# Patient Record
Sex: Female | Born: 1965 | Race: White | Hispanic: No | Marital: Single
Health system: Southern US, Community
[De-identification: ages and names within clinical notes are randomized; demographics above are authoritative.]

---

## 2004-02-24 ENCOUNTER — Inpatient Hospital Stay: Payer: Self-pay | Admitting: Unknown Physician Specialty

## 2004-03-26 ENCOUNTER — Observation Stay: Payer: Self-pay

## 2004-04-27 ENCOUNTER — Inpatient Hospital Stay: Payer: Self-pay | Admitting: Obstetrics & Gynecology

## 2004-05-29 ENCOUNTER — Ambulatory Visit: Payer: Self-pay | Admitting: Specialist

## 2004-08-19 ENCOUNTER — Emergency Department: Payer: Self-pay | Admitting: Emergency Medicine

## 2004-08-22 ENCOUNTER — Emergency Department: Payer: Self-pay | Admitting: General Practice

## 2005-11-04 ENCOUNTER — Emergency Department: Payer: Self-pay | Admitting: Emergency Medicine

## 2006-11-20 ENCOUNTER — Emergency Department: Payer: Self-pay | Admitting: Emergency Medicine

## 2007-04-25 ENCOUNTER — Emergency Department: Payer: Self-pay | Admitting: Emergency Medicine

## 2007-07-27 ENCOUNTER — Emergency Department: Payer: Self-pay | Admitting: Emergency Medicine

## 2007-09-06 ENCOUNTER — Observation Stay: Payer: Self-pay | Admitting: Internal Medicine

## 2007-09-06 ENCOUNTER — Other Ambulatory Visit: Payer: Self-pay

## 2007-09-13 ENCOUNTER — Emergency Department: Payer: Self-pay | Admitting: Emergency Medicine

## 2008-07-14 ENCOUNTER — Emergency Department: Payer: Self-pay | Admitting: Emergency Medicine

## 2008-10-18 ENCOUNTER — Emergency Department: Payer: Self-pay | Admitting: Emergency Medicine

## 2009-11-23 ENCOUNTER — Emergency Department: Payer: Self-pay | Admitting: Emergency Medicine

## 2010-11-13 ENCOUNTER — Inpatient Hospital Stay: Payer: Self-pay | Admitting: Internal Medicine

## 2010-11-28 ENCOUNTER — Inpatient Hospital Stay: Payer: Self-pay | Admitting: Internal Medicine

## 2011-06-12 ENCOUNTER — Inpatient Hospital Stay: Payer: Self-pay | Admitting: Internal Medicine

## 2011-06-12 LAB — CBC WITH DIFFERENTIAL/PLATELET
Basophil #: 0 10*3/uL (ref 0.0–0.1)
Basophil %: 0.2 %
Comment - H1-Com1: NORMAL
Comment - H1-Com2: NORMAL
Eosinophil #: 1.5 10*3/uL — ABNORMAL HIGH (ref 0.0–0.7)
Eosinophil %: 9.5 %
Eosinophil: 8 %
HGB: 14.5 g/dL (ref 12.0–16.0)
MCH: 31.5 pg (ref 26.0–34.0)
MCV: 92 fL (ref 80–100)
Monocyte #: 0.6 10*3/uL (ref 0.0–0.7)
Neutrophil %: 61.4 %
RBC: 4.6 10*6/uL (ref 3.80–5.20)
WBC: 15.4 10*3/uL — ABNORMAL HIGH (ref 3.6–11.0)

## 2011-06-12 LAB — COMPREHENSIVE METABOLIC PANEL
Albumin: 3.8 g/dL (ref 3.4–5.0)
Anion Gap: 11 (ref 7–16)
Calcium, Total: 8.8 mg/dL (ref 8.5–10.1)
Creatinine: 0.69 mg/dL (ref 0.60–1.30)
EGFR (Non-African Amer.): >60
Glucose: 92 mg/dL (ref 65–99)
Osmolality: 283 (ref 275–301)
Potassium: 3.4 mmol/L — ABNORMAL LOW (ref 3.5–5.1)
SGOT(AST): 19 U/L (ref 15–37)
SGPT (ALT): 18 U/L

## 2011-06-12 LAB — TROPONIN I
Troponin-I: <0.02 ng/mL
Troponin-I: <0.02 ng/mL
Troponin-I: <0.02 ng/mL

## 2011-06-12 LAB — CK TOTAL AND CKMB (NOT AT ARMC)
CK, Total: 54 U/L (ref 21–215)
CK, Total: 55 U/L (ref 21–215)
CK, Total: 52 U/L (ref 21–215)
CK-MB: 0.7 ng/mL (ref 0.5–3.6)
CK-MB: 1.1 ng/mL (ref 0.5–3.6)

## 2011-06-13 LAB — URINALYSIS, COMPLETE
Glucose,UR: >=500 mg/dL (ref 0–75)
Ketone: NEGATIVE
Leukocyte Esterase: NEGATIVE
Nitrite: NEGATIVE
Ph: 6 (ref 4.5–8.0)
Protein: NEGATIVE
RBC,UR: NONE SEEN /HPF (ref 0–5)
Specific Gravity: 1.006 (ref 1.003–1.030)
WBC UR: <1 /HPF (ref 0–5)

## 2011-06-13 LAB — CBC WITH DIFFERENTIAL/PLATELET
Basophil %: 0.1 %
Eosinophil %: 0.1 %
Lymphocyte #: 1.7 10*3/uL (ref 1.0–3.6)
MCH: 31.8 pg (ref 26.0–34.0)
MCHC: 34.1 g/dL (ref 32.0–36.0)
MCV: 93 fL (ref 80–100)
Monocyte %: 1.3 %
Platelet: 288 10*3/uL (ref 150–440)
RBC: 3.76 10*6/uL — ABNORMAL LOW (ref 3.80–5.20)

## 2011-06-13 LAB — DRUG SCREEN, URINE
Amphetamines, Ur Screen: NEGATIVE (ref ?–1000)
Benzodiazepine, Ur Scrn: POSITIVE (ref ?–200)
Cannabinoid 50 Ng, Ur ~~LOC~~: NEGATIVE (ref ?–50)
Cocaine Metabolite,Ur ~~LOC~~: POSITIVE (ref ?–300)
MDMA (Ecstasy)Ur Screen: NEGATIVE (ref ?–500)
Phencyclidine (PCP) Ur S: NEGATIVE (ref ?–25)

## 2011-06-13 LAB — BASIC METABOLIC PANEL
Chloride: 108 mmol/L — ABNORMAL HIGH (ref 98–107)
Co2: 19 mmol/L — ABNORMAL LOW (ref 21–32)
Creatinine: 0.94 mg/dL (ref 0.60–1.30)
EGFR (African American): >60
Potassium: 3.6 mmol/L (ref 3.5–5.1)
Sodium: 138 mmol/L (ref 136–145)

## 2011-07-29 ENCOUNTER — Inpatient Hospital Stay: Payer: Self-pay | Admitting: Specialist

## 2011-07-29 LAB — COMPREHENSIVE METABOLIC PANEL
Albumin: 3.8 g/dL (ref 3.4–5.0)
Alkaline Phosphatase: 111 U/L (ref 50–136)
BUN: 4 mg/dL — ABNORMAL LOW (ref 7–18)
Calcium, Total: 8.7 mg/dL (ref 8.5–10.1)
Chloride: 109 mmol/L — ABNORMAL HIGH (ref 98–107)
Co2: 20 mmol/L — ABNORMAL LOW (ref 21–32)
EGFR (African American): >60
EGFR (Non-African Amer.): >60
Osmolality: 280 (ref 275–301)
SGOT(AST): 16 U/L (ref 15–37)
SGPT (ALT): 14 U/L
Total Protein: 7.7 g/dL (ref 6.4–8.2)

## 2011-07-29 LAB — TROPONIN I: Troponin-I: <0.02 ng/mL

## 2011-07-29 LAB — CBC
HCT: 42.5 % (ref 35.0–47.0)
MCH: 31.3 pg (ref 26.0–34.0)
MCHC: 33.9 g/dL (ref 32.0–36.0)
Platelet: 210 10*3/uL (ref 150–440)
RBC: 4.6 10*6/uL (ref 3.80–5.20)
RDW: 13.2 % (ref 11.5–14.5)
WBC: 11.9 10*3/uL — ABNORMAL HIGH (ref 3.6–11.0)

## 2011-07-30 LAB — DRUG SCREEN, URINE
Amphetamines, Ur Screen: NEGATIVE (ref ?–1000)
Barbiturates, Ur Screen: NEGATIVE (ref ?–200)
Benzodiazepine, Ur Scrn: NEGATIVE (ref ?–200)
Cannabinoid 50 Ng, Ur ~~LOC~~: NEGATIVE (ref ?–50)
MDMA (Ecstasy)Ur Screen: NEGATIVE (ref ?–500)
Methadone, Ur Screen: NEGATIVE (ref ?–300)
Phencyclidine (PCP) Ur S: NEGATIVE (ref ?–25)
Tricyclic, Ur Screen: POSITIVE (ref ?–1000)

## 2011-07-30 LAB — URINALYSIS, COMPLETE
Bacteria: NONE SEEN
Bilirubin,UR: NEGATIVE
Glucose,UR: NEGATIVE mg/dL (ref 0–75)
Ketone: NEGATIVE
Nitrite: NEGATIVE
Ph: 6 (ref 4.5–8.0)
Protein: NEGATIVE
RBC,UR: NONE SEEN /HPF (ref 0–5)
Squamous Epithelial: 1

## 2011-07-30 LAB — CBC WITH DIFFERENTIAL/PLATELET
Basophil #: 0 10*3/uL (ref 0.0–0.1)
Eosinophil #: 0 10*3/uL (ref 0.0–0.7)
HGB: 14.5 g/dL (ref 12.0–16.0)
Lymphocyte %: 9 %
MCHC: 33.6 g/dL (ref 32.0–36.0)
MCV: 94 fL (ref 80–100)
Monocyte #: 0 10*3/uL (ref 0.0–0.7)
Monocyte %: 0.3 %
Neutrophil %: 90.2 %
Platelet: 205 10*3/uL (ref 150–440)
RBC: 4.62 10*6/uL (ref 3.80–5.20)
WBC: 10.8 10*3/uL (ref 3.6–11.0)

## 2011-07-30 LAB — BASIC METABOLIC PANEL
Anion Gap: 15 (ref 7–16)
Calcium, Total: 9.3 mg/dL (ref 8.5–10.1)
Chloride: 106 mmol/L (ref 98–107)
Co2: 22 mmol/L (ref 21–32)
Creatinine: 1.06 mg/dL (ref 0.60–1.30)
EGFR (African American): >60
EGFR (Non-African Amer.): 59 — ABNORMAL LOW
Glucose: 178 mg/dL — ABNORMAL HIGH (ref 65–99)
Osmolality: 287 (ref 275–301)
Potassium: 4.6 mmol/L (ref 3.5–5.1)

## 2011-07-30 LAB — MAGNESIUM: Magnesium: 1.9 mg/dL

## 2012-08-19 ENCOUNTER — Emergency Department: Payer: Self-pay | Admitting: Emergency Medicine

## 2012-08-19 LAB — CBC
HCT: 41.9 % (ref 35.0–47.0)
MCH: 31 pg (ref 26.0–34.0)
MCV: 91 fL (ref 80–100)

## 2012-08-19 LAB — BASIC METABOLIC PANEL
Anion Gap: 8 (ref 7–16)
Chloride: 111 mmol/L — ABNORMAL HIGH (ref 98–107)
Co2: 21 mmol/L (ref 21–32)
EGFR (African American): >60
Glucose: 109 mg/dL — ABNORMAL HIGH (ref 65–99)
Osmolality: 278 (ref 275–301)
Sodium: 140 mmol/L (ref 136–145)

## 2012-10-31 ENCOUNTER — Emergency Department: Payer: Self-pay | Admitting: Emergency Medicine

## 2013-01-22 IMAGING — CR DG CHEST 1V PORT
1 series · 1 of 1 positions shown · non-contrast
Comparison: none

REASON FOR EXAM: post intub.  lmp unknown, pt critical
COMMENTS:   LMP: N/A

PROCEDURE:     DXR - DXR PORTABLE CHEST SINGLE VIEW  - November 13, 2010  [DATE]
RESULT:     Comparison: None

[view not recorded]
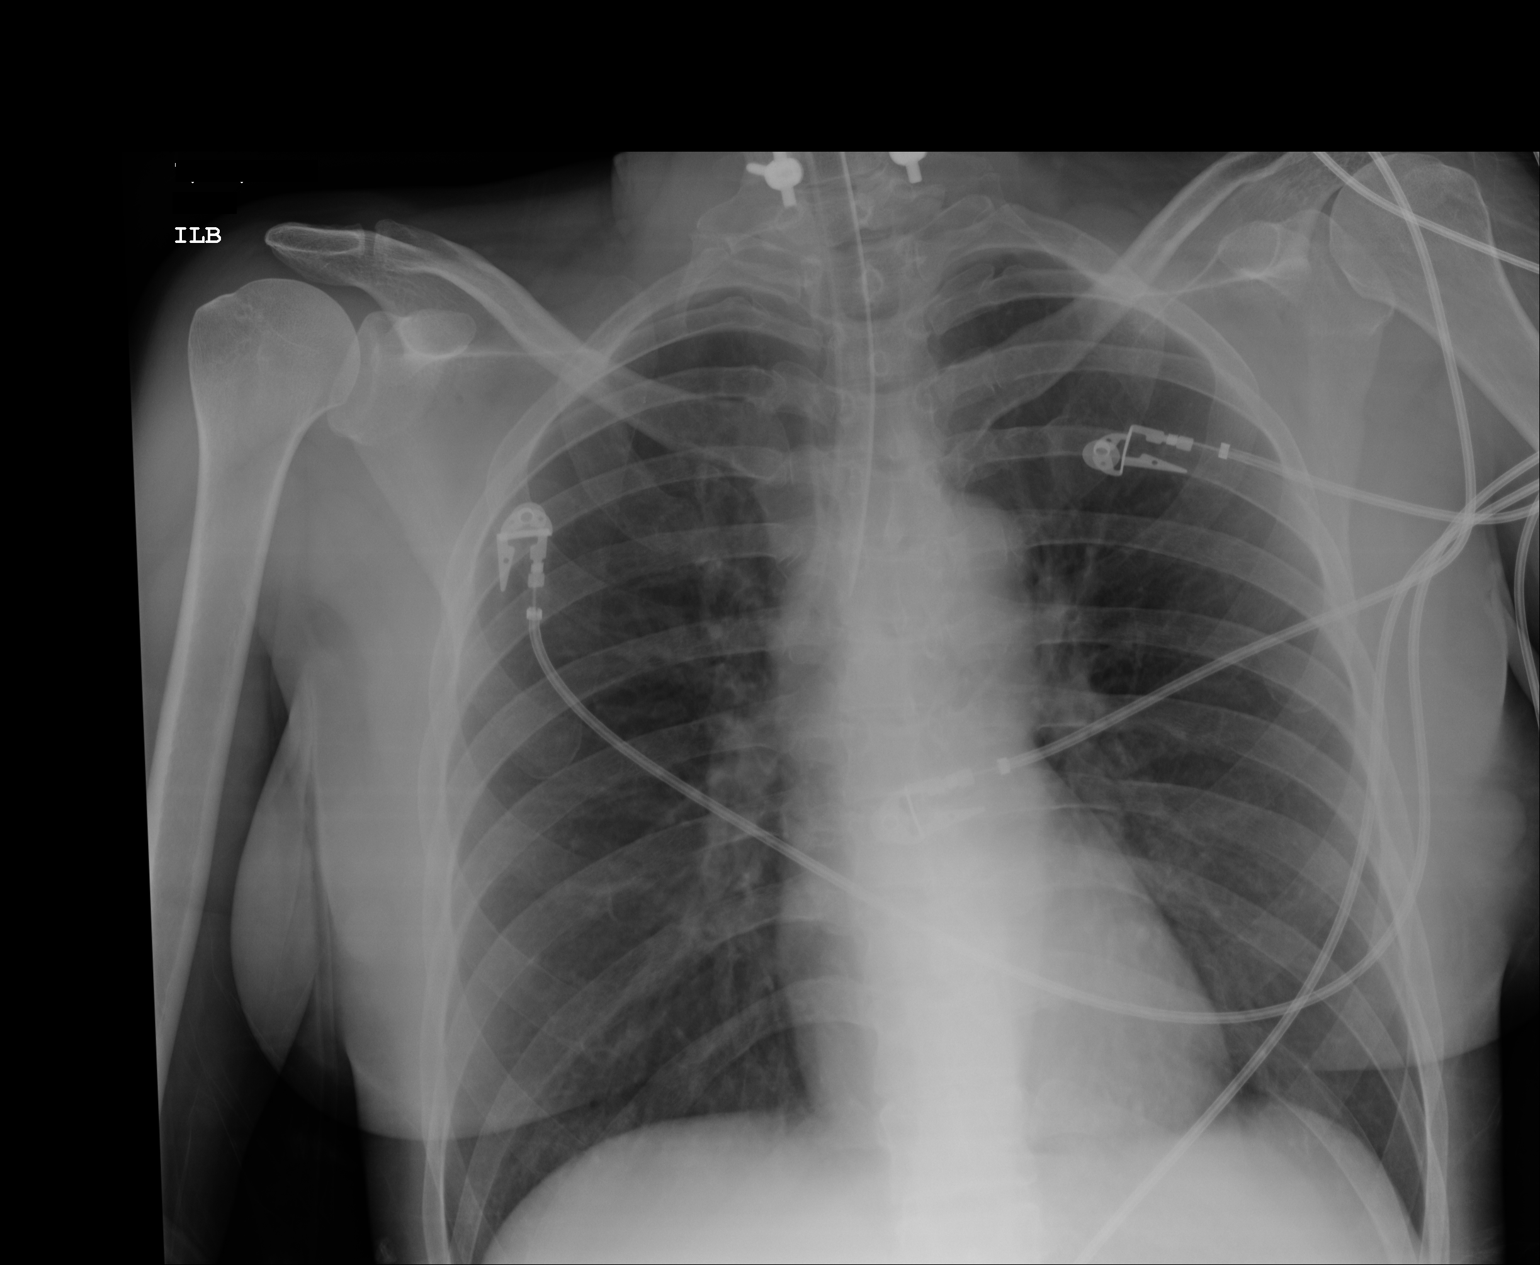

[1 of 1 positions shown; findings below may reference images not displayed]

FINDINGS: Heart is normal in size. Endotracheal tube tip is at the carina, directed
towards the right mainstem bronchus. The lungs are clear. The inferior most
aspect of the right costophrenic angle is excluded from the field-of-view.
IMPRESSION: 1. Endotracheal tube at the carina, directed towards the right mainstem
bronchus. Consider slight withdrawal. This was discussed with Dr. Auad
Enzi at 7706 hours 11/13/2010.
2. Lungs are clear.

## 2013-02-10 IMAGING — CR DG CHEST 1V PORT
1 series · 1 of 1 positions shown · non-contrast
Comparison: none

REASON FOR EXAM: on vent
COMMENTS:

[view not recorded]
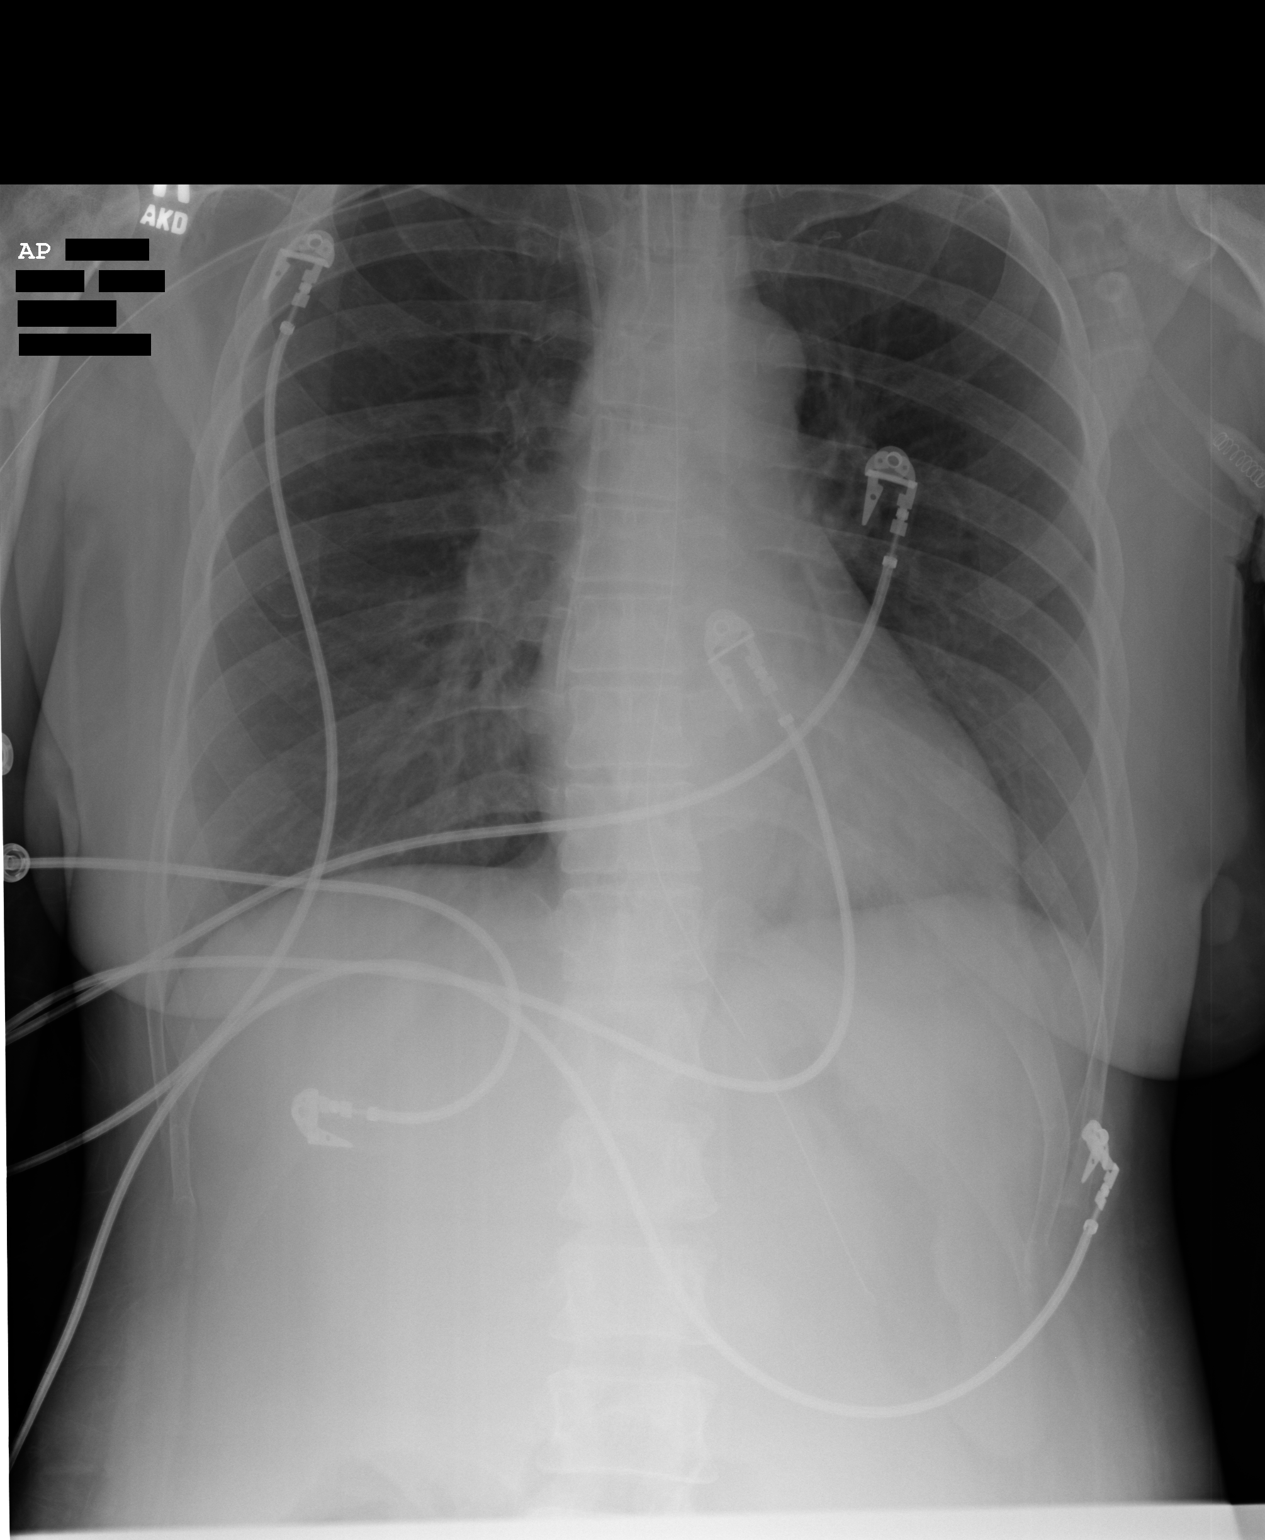

[1 of 1 positions shown; findings below may reference images not displayed]

PROCEDURE:     DXR - DXR PORTABLE CHEST SINGLE VIEW  - December 02, 2010  [DATE]

RESULT:     Comparison is made to the study 29 November, 2010.

The endotracheal tube tip lies at the level of the inferior margin of the
clavicular heads. The right internal jugular venous catheter tip lies in the
region of the junction of the SVC with the right atrium. The nasogastric
tube tip lies in the region of the gastric body with the proximal port just
below the expected level of the GE junction.

The lungs are adequately inflated. The interstitial markings are minimally
prominent at the right lung base. There is no pleural effusion. The cardiac
silhouette is normal in size. The pulmonary vascularity is not engorged.
IMPRESSION: Since the study 29 November, 2010 there has not been
dramatic interval change in the appearance of the chest. Slightly increased
prominence of the interstitial markings at the right lung base are noted.

## 2013-02-11 IMAGING — CR DG CHEST 1V PORT
1 series · 1 of 1 positions shown · non-contrast
Comparison: none

REASON FOR EXAM: right sided wheezing, post extubation
COMMENTS:

[view not recorded]
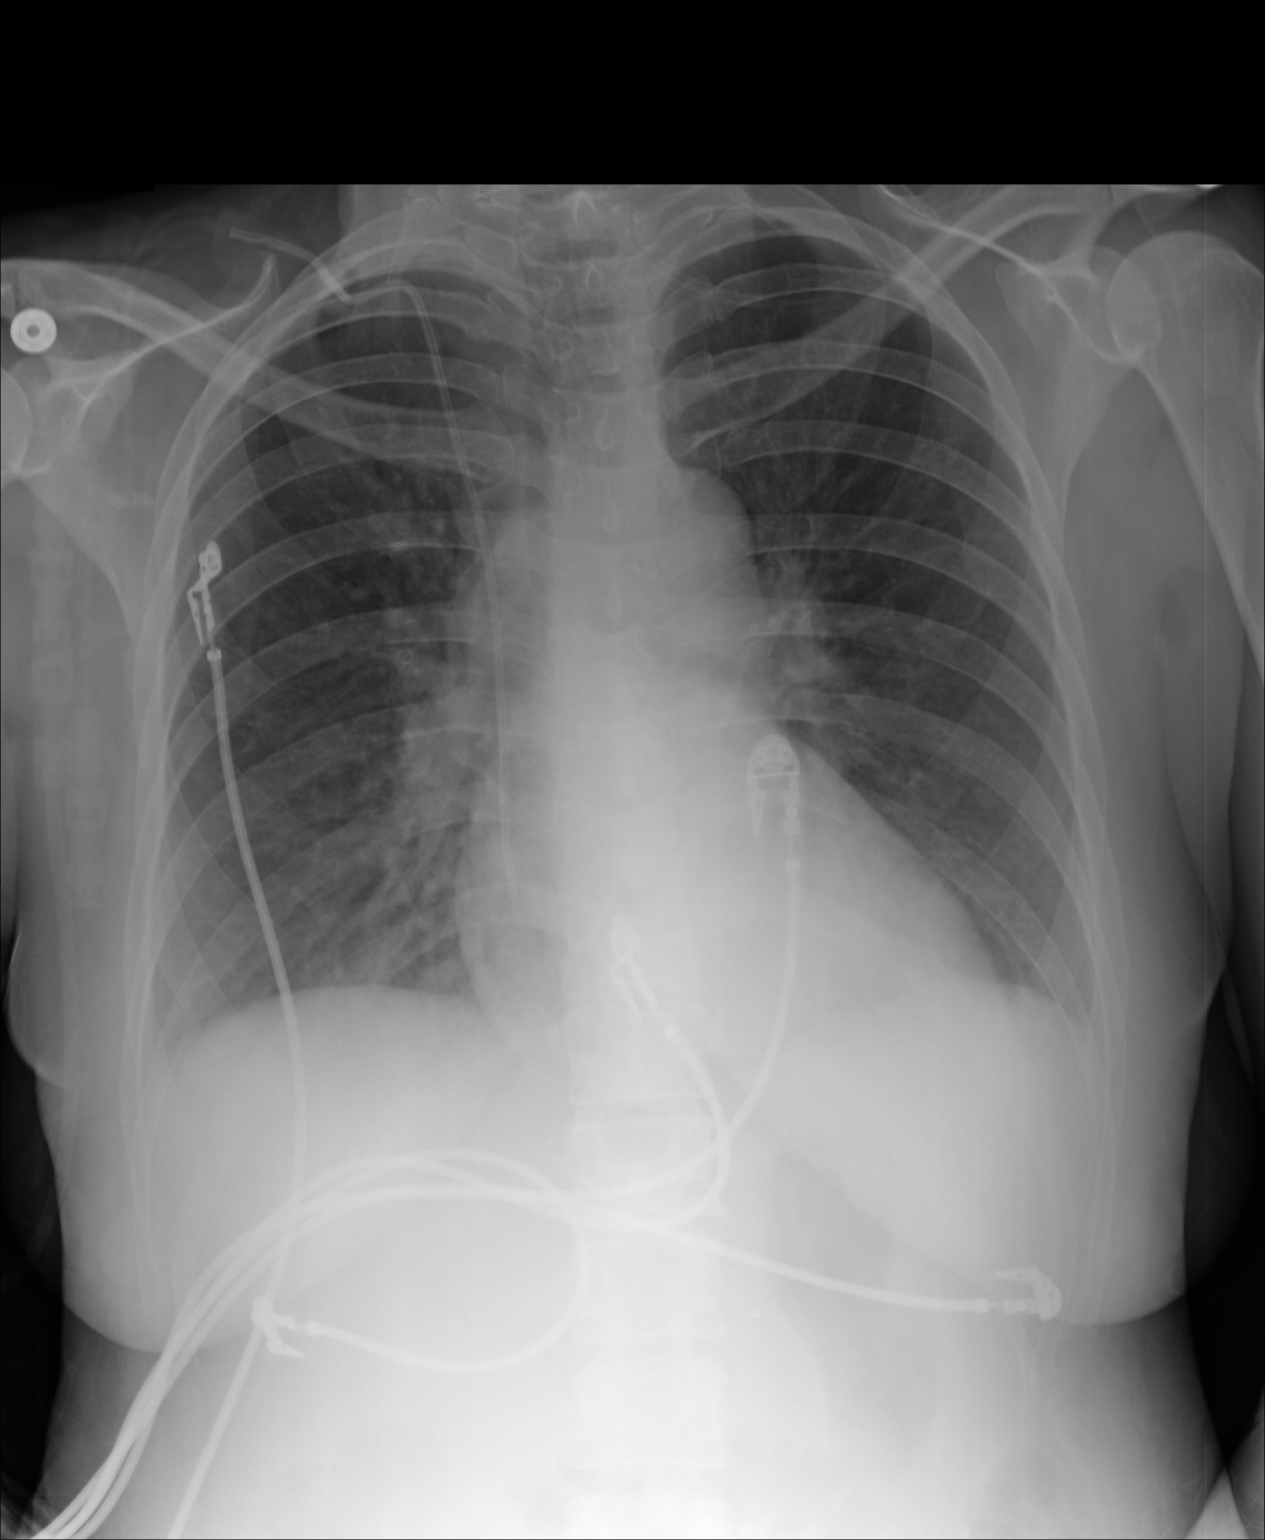

[1 of 1 positions shown; findings below may reference images not displayed]

PROCEDURE:     DXR - DXR PORTABLE CHEST SINGLE VIEW  - December 03, 2010  [DATE]

RESULT:     Images are compared to the previous exam dated 12/02/2010.

Endotracheal tube has been removed. Right sided central venous catheter
remains in place. There is no definite infiltrate, effusion or pneumothorax.
Monitoring electrodes are present. The cardiac silhouette appears normal.
There appear to be hardware densities in the cervical spine consistent with
previous cervical surgery.
IMPRESSION: Interval extubation.

## 2013-02-13 IMAGING — CR DG THORACIC SPINE 2-3V
1 series · 2 of 2 positions shown · non-contrast
Comparison: none

REASON FOR EXAM: back pain
COMMENTS:

[Series 1: view not recorded · 0.17mm/px · 2 of 2 slices shown]
[im 1/2]
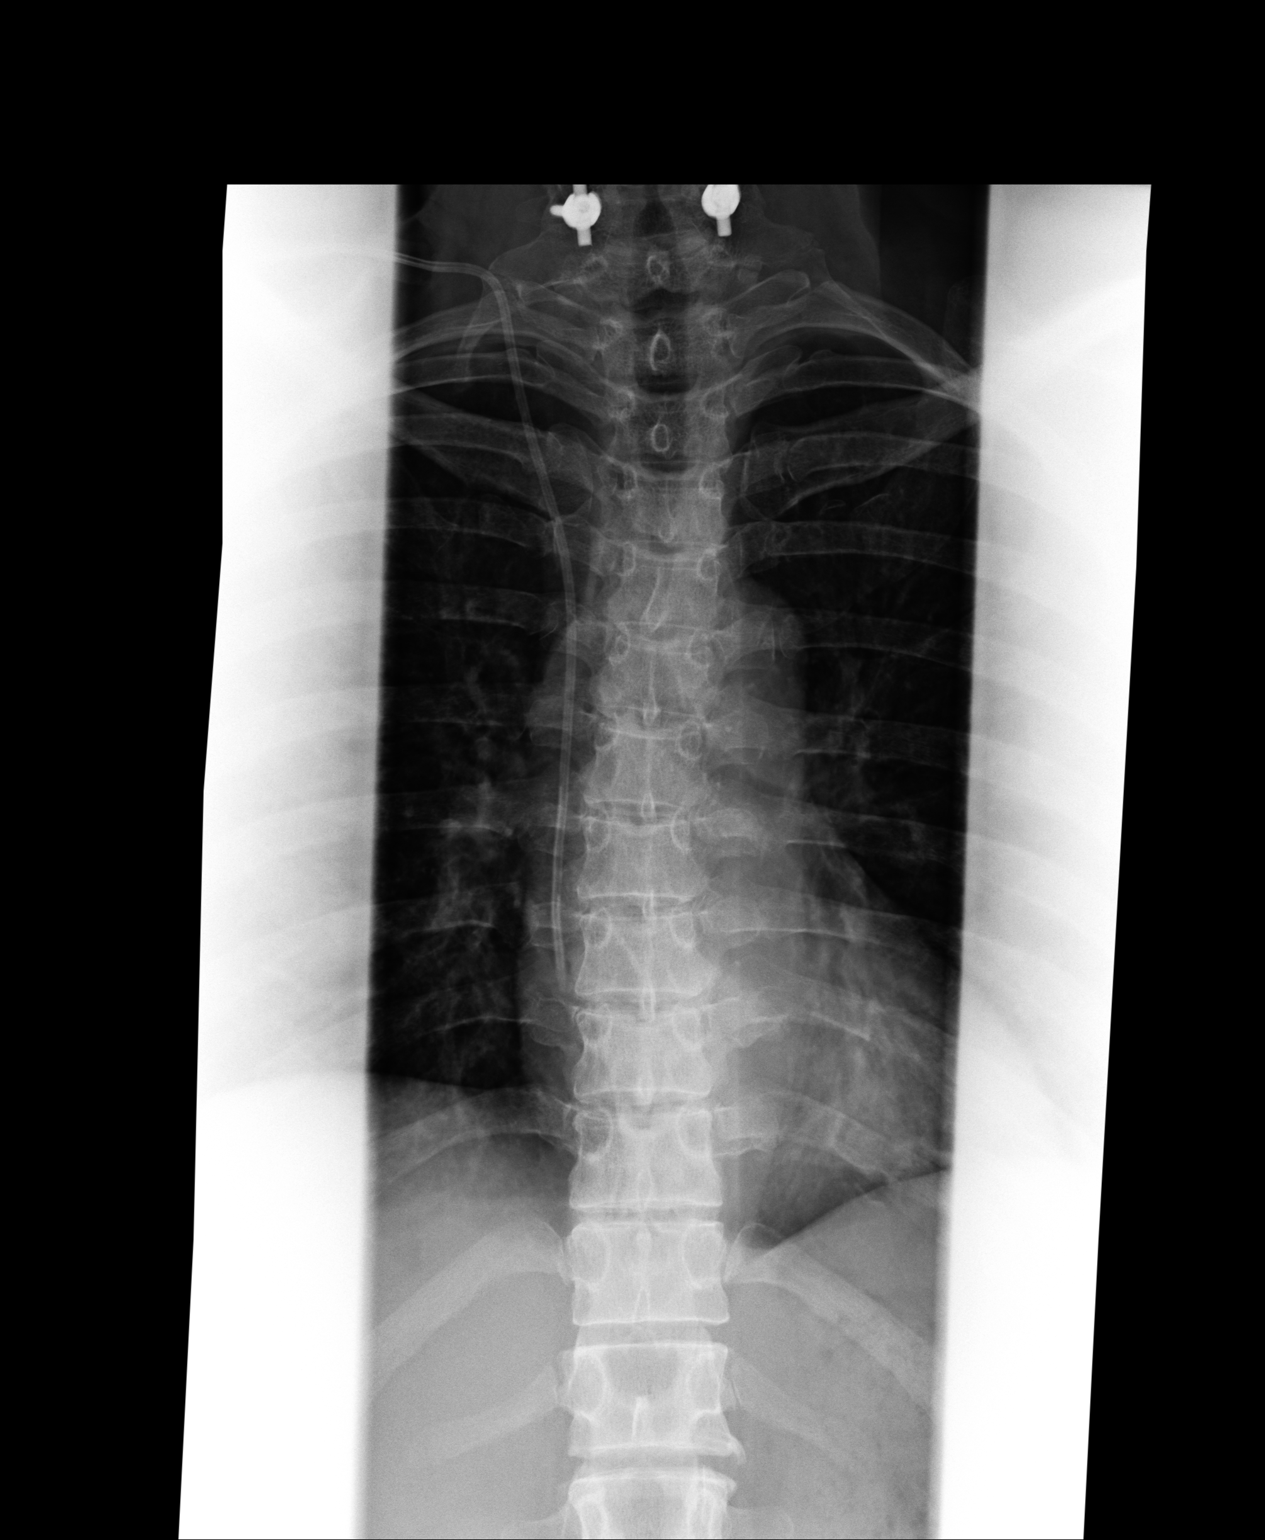
[im 2/2]
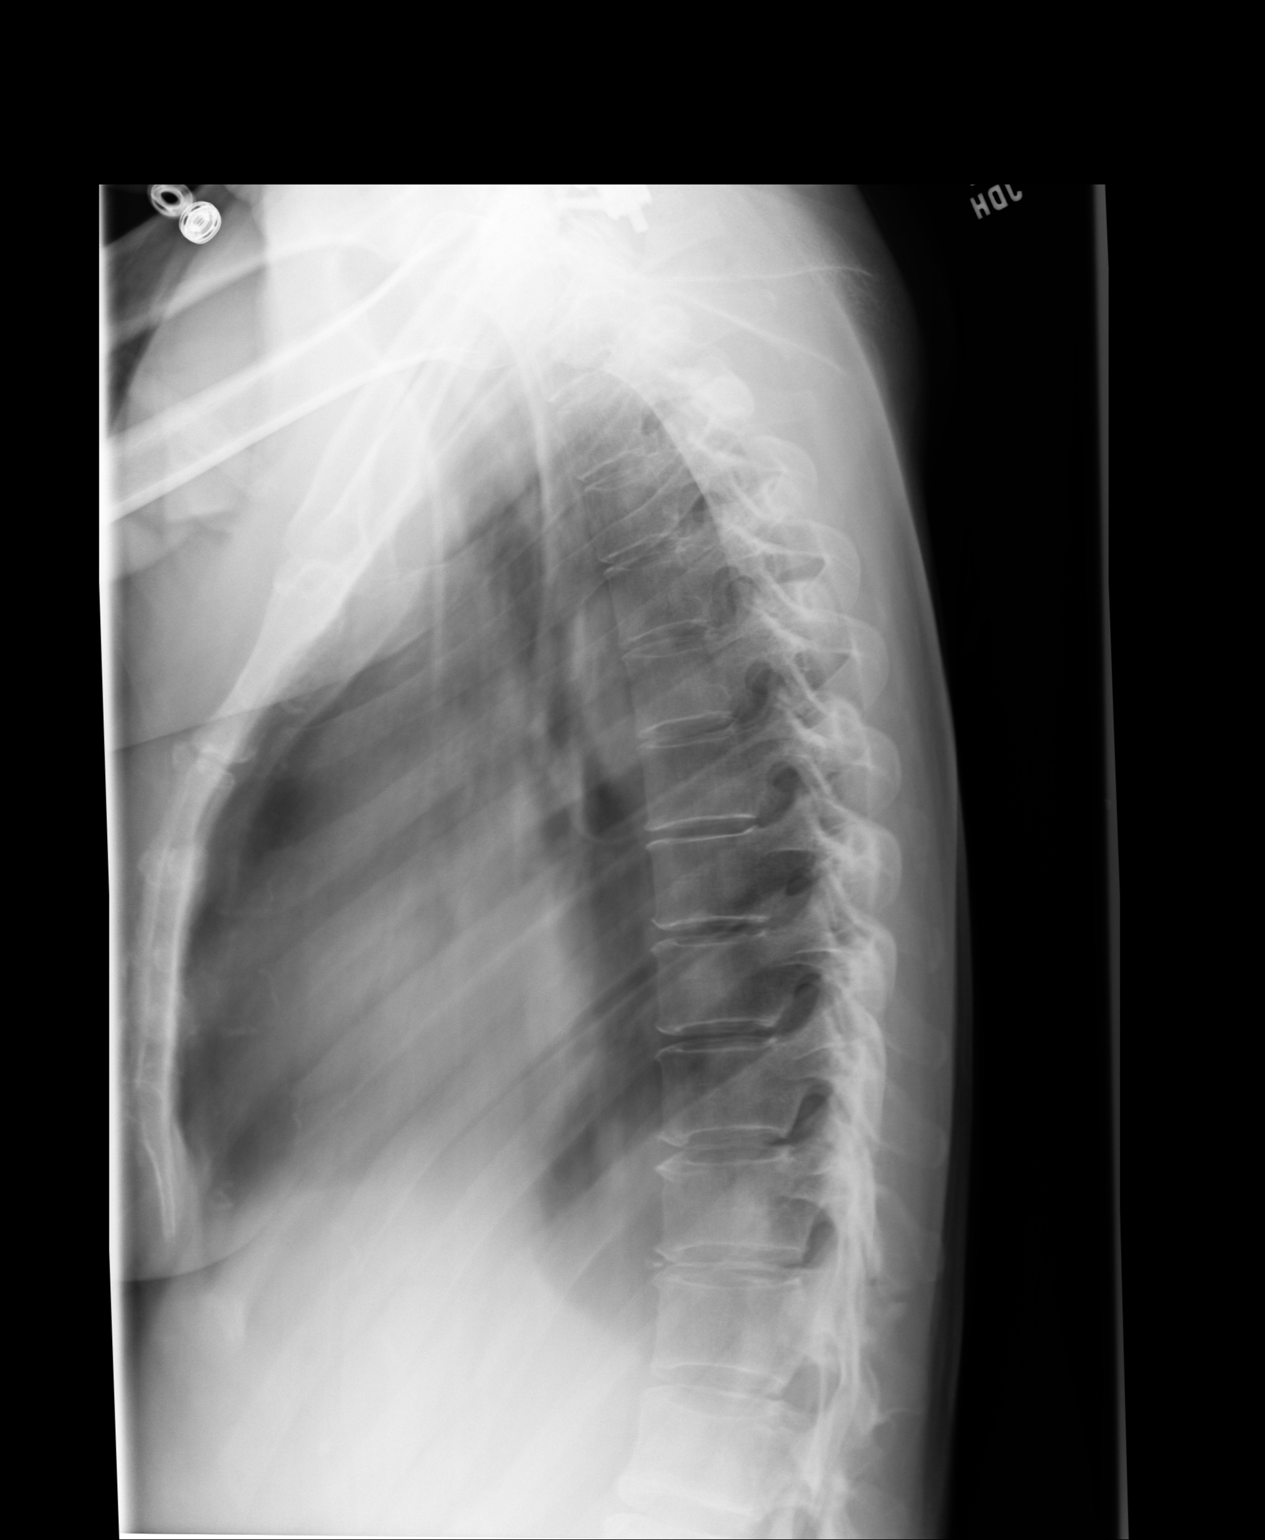

[2 of 2 positions shown; findings below may reference images not displayed]

PROCEDURE:     DXR - DXR THORACIC  AP AND LATERAL  - December 05, 2010  [DATE]

RESULT:     AP and lateral views of the thoracic spine are submitted. There
is a right internal jugular venous catheter whose tip lies in the region of
the distal SVC. There is gentle curvature of the thoracic spine with the
convexity toward the right. The pedicles appear intact. The vertebral bodies
are preserved in height where visualized. I see no abnormal paravertebral
soft tissue densities.
IMPRESSION: I see no acute bony abnormality of the visualized portions
of the thoracic spine.

## 2013-02-15 ENCOUNTER — Inpatient Hospital Stay: Payer: Self-pay | Admitting: Emergency Medicine

## 2013-02-15 LAB — COMPREHENSIVE METABOLIC PANEL
Alkaline Phosphatase: 129 U/L (ref 50–136)
Anion Gap: 11 (ref 7–16)
Calcium, Total: 8.7 mg/dL (ref 8.5–10.1)
Chloride: 102 mmol/L (ref 98–107)
Creatinine: 0.41 mg/dL — ABNORMAL LOW (ref 0.60–1.30)
EGFR (African American): >60
Osmolality: 261 (ref 275–301)
Potassium: 4.7 mmol/L (ref 3.5–5.1)
SGOT(AST): 19 U/L (ref 15–37)
Sodium: 131 mmol/L — ABNORMAL LOW (ref 136–145)

## 2013-02-15 LAB — DRUG SCREEN, URINE
Amphetamines, Ur Screen: NEGATIVE (ref ?–1000)
Benzodiazepine, Ur Scrn: POSITIVE (ref ?–200)
Cannabinoid 50 Ng, Ur ~~LOC~~: NEGATIVE (ref ?–50)
Cocaine Metabolite,Ur ~~LOC~~: POSITIVE (ref ?–300)
MDMA (Ecstasy)Ur Screen: NEGATIVE (ref ?–500)
Methadone, Ur Screen: NEGATIVE (ref ?–300)
Opiate, Ur Screen: POSITIVE (ref ?–300)
Phencyclidine (PCP) Ur S: NEGATIVE (ref ?–25)
Tricyclic, Ur Screen: NEGATIVE (ref ?–1000)

## 2013-02-15 LAB — CBC
HCT: 45.3 % (ref 35.0–47.0)
HGB: 15.6 g/dL (ref 12.0–16.0)
MCH: 31.3 pg (ref 26.0–34.0)
MCV: 91 fL (ref 80–100)
Platelet: 299 10*3/uL (ref 150–440)
RBC: 5 10*6/uL (ref 3.80–5.20)
RDW: 13.3 % (ref 11.5–14.5)

## 2013-02-15 LAB — URINALYSIS, COMPLETE
Bilirubin,UR: NEGATIVE
Blood: NEGATIVE
Glucose,UR: NEGATIVE mg/dL (ref 0–75)
Leukocyte Esterase: NEGATIVE
Nitrite: NEGATIVE
Ph: 6 (ref 4.5–8.0)
Protein: 100
Specific Gravity: 1.017 (ref 1.003–1.030)
Squamous Epithelial: 1
WBC UR: <1 /HPF (ref 0–5)

## 2013-02-15 LAB — ETHANOL: Ethanol: <3 mg/dL

## 2013-02-15 LAB — CK TOTAL AND CKMB (NOT AT ARMC)
CK, Total: 75 U/L (ref 21–215)
CK-MB: 3 ng/mL (ref 0.5–3.6)

## 2013-02-15 LAB — PRO B NATRIURETIC PEPTIDE: B-Type Natriuretic Peptide: 722 pg/mL — ABNORMAL HIGH (ref 0–125)

## 2013-02-15 LAB — CK: CK, Total: 61 U/L (ref 21–215)

## 2013-02-15 LAB — ACETAMINOPHEN LEVEL: Acetaminophen: <2 ug/mL

## 2013-02-16 LAB — CBC WITH DIFFERENTIAL/PLATELET
Basophil %: 0 %
Eosinophil #: 0 10*3/uL (ref 0.0–0.7)
Lymphocyte %: 9.9 %
MCH: 31.6 pg (ref 26.0–34.0)
MCHC: 35.2 g/dL (ref 32.0–36.0)
MCV: 90 fL (ref 80–100)
Monocyte #: 0.2 x10 3/mm (ref 0.2–0.9)
Monocyte %: 1.2 %
Neutrophil #: 13.9 10*3/uL — ABNORMAL HIGH (ref 1.4–6.5)
Platelet: 283 10*3/uL (ref 150–440)
RBC: 4.41 10*6/uL (ref 3.80–5.20)
RDW: 13 % (ref 11.5–14.5)
WBC: 15.6 10*3/uL — ABNORMAL HIGH (ref 3.6–11.0)

## 2013-02-16 LAB — BASIC METABOLIC PANEL
BUN: 9 mg/dL (ref 7–18)
Calcium, Total: 9.2 mg/dL (ref 8.5–10.1)
Chloride: 107 mmol/L (ref 98–107)
Creatinine: 0.97 mg/dL (ref 0.60–1.30)
EGFR (Non-African Amer.): >60
Glucose: 167 mg/dL — ABNORMAL HIGH (ref 65–99)
Osmolality: 276 (ref 275–301)
Potassium: 3.3 mmol/L — ABNORMAL LOW (ref 3.5–5.1)
Sodium: 137 mmol/L (ref 136–145)

## 2013-03-03 ENCOUNTER — Inpatient Hospital Stay: Payer: Self-pay | Admitting: Internal Medicine

## 2013-03-03 LAB — DRUG SCREEN, URINE
Amphetamines, Ur Screen: NEGATIVE (ref ?–1000)
Barbiturates, Ur Screen: NEGATIVE (ref ?–200)
Cannabinoid 50 Ng, Ur ~~LOC~~: NEGATIVE (ref ?–50)
Cocaine Metabolite,Ur ~~LOC~~: POSITIVE (ref ?–300)
MDMA (Ecstasy)Ur Screen: NEGATIVE (ref ?–500)
Methadone, Ur Screen: NEGATIVE (ref ?–300)
Opiate, Ur Screen: POSITIVE (ref ?–300)
Phencyclidine (PCP) Ur S: NEGATIVE (ref ?–25)
Tricyclic, Ur Screen: NEGATIVE (ref ?–1000)

## 2013-03-03 LAB — CK TOTAL AND CKMB (NOT AT ARMC)
CK, Total: 74 U/L (ref 21–215)
CK, Total: 90 U/L (ref 21–215)
CK, Total: 78 U/L (ref 21–215)
CK-MB: 1.8 ng/mL (ref 0.5–3.6)

## 2013-03-03 LAB — COMPREHENSIVE METABOLIC PANEL
Albumin: 3 g/dL — ABNORMAL LOW (ref 3.4–5.0)
Alkaline Phosphatase: 111 U/L (ref 50–136)
BUN: 7 mg/dL (ref 7–18)
Bilirubin,Total: 0.4 mg/dL (ref 0.2–1.0)
Calcium, Total: 8.2 mg/dL — ABNORMAL LOW (ref 8.5–10.1)
Chloride: 104 mmol/L (ref 98–107)
Co2: 21 mmol/L (ref 21–32)
EGFR (African American): >60
EGFR (Non-African Amer.): 60 — ABNORMAL LOW
Glucose: 248 mg/dL — ABNORMAL HIGH (ref 65–99)
SGPT (ALT): 17 U/L (ref 12–78)
Total Protein: 6.7 g/dL (ref 6.4–8.2)

## 2013-03-03 LAB — ETHANOL
Ethanol %: <0.003 % (ref 0.000–0.080)
Ethanol: <3 mg/dL

## 2013-03-03 LAB — CBC
HCT: 41.2 % (ref 35.0–47.0)
HGB: 13.7 g/dL (ref 12.0–16.0)
MCH: 31.2 pg (ref 26.0–34.0)
MCHC: 33.2 g/dL (ref 32.0–36.0)
MCV: 94 fL (ref 80–100)
Platelet: 262 10*3/uL (ref 150–440)
RBC: 4.38 10*6/uL (ref 3.80–5.20)
WBC: 14.6 10*3/uL — ABNORMAL HIGH (ref 3.6–11.0)

## 2013-03-03 LAB — URINALYSIS, COMPLETE
Bilirubin,UR: NEGATIVE
Nitrite: NEGATIVE
Ph: 7 (ref 4.5–8.0)
RBC,UR: 1 /HPF (ref 0–5)
Squamous Epithelial: NONE SEEN
WBC UR: <1 /HPF (ref 0–5)

## 2013-03-03 LAB — PROTIME-INR
INR: 1.2
Prothrombin Time: 15.3 secs — ABNORMAL HIGH (ref 11.5–14.7)

## 2013-03-03 LAB — LIPASE, BLOOD: Lipase: 64 U/L — ABNORMAL LOW (ref 73–393)

## 2013-03-03 LAB — TROPONIN I: Troponin-I: 0.12 ng/mL — ABNORMAL HIGH

## 2013-03-03 LAB — PHOSPHORUS: Phosphorus: 3.6 mg/dL (ref 2.5–4.9)

## 2013-03-03 LAB — MAGNESIUM: Magnesium: 1.8 mg/dL

## 2013-03-03 LAB — TSH: Thyroid Stimulating Horm: 1.02 u[IU]/mL

## 2013-03-04 LAB — COMPREHENSIVE METABOLIC PANEL
Albumin: 2.8 g/dL — ABNORMAL LOW (ref 3.4–5.0)
Alkaline Phosphatase: 90 U/L (ref 50–136)
Anion Gap: 3 — ABNORMAL LOW (ref 7–16)
BUN: 5 mg/dL — ABNORMAL LOW (ref 7–18)
Bilirubin,Total: 0.3 mg/dL (ref 0.2–1.0)
Calcium, Total: 8.6 mg/dL (ref 8.5–10.1)
Creatinine: 0.79 mg/dL (ref 0.60–1.30)
EGFR (Non-African Amer.): >60
Glucose: 194 mg/dL — ABNORMAL HIGH (ref 65–99)
Osmolality: 286 (ref 275–301)
Potassium: 4.3 mmol/L (ref 3.5–5.1)
SGOT(AST): 27 U/L (ref 15–37)
SGPT (ALT): 16 U/L (ref 12–78)
Sodium: 142 mmol/L (ref 136–145)
Total Protein: 5.7 g/dL — ABNORMAL LOW (ref 6.4–8.2)

## 2013-03-04 LAB — CBC WITH DIFFERENTIAL/PLATELET
Basophil #: 0 10*3/uL (ref 0.0–0.1)
Basophil %: 0.1 %
Eosinophil #: 0 10*3/uL (ref 0.0–0.7)
Eosinophil %: 0 %
HGB: 13 g/dL (ref 12.0–16.0)
Lymphocyte %: 8.7 %
MCHC: 34.7 g/dL (ref 32.0–36.0)
MCV: 92 fL (ref 80–100)
Monocyte #: 0.2 x10 3/mm (ref 0.2–0.9)
Monocyte %: 1.5 %
Neutrophil #: 13.1 10*3/uL — ABNORMAL HIGH (ref 1.4–6.5)
Neutrophil %: 89.7 %
RBC: 4.06 10*6/uL (ref 3.80–5.20)
RDW: 13.5 % (ref 11.5–14.5)
WBC: 14.6 10*3/uL — ABNORMAL HIGH (ref 3.6–11.0)

## 2013-03-04 LAB — T4, FREE: Free Thyroxine: 0.95 ng/dL (ref 0.76–1.46)

## 2013-03-04 LAB — MAGNESIUM: Magnesium: 1.6 mg/dL — ABNORMAL LOW

## 2013-03-04 LAB — URINE CULTURE

## 2013-03-04 LAB — LIPID PANEL
Cholesterol: 160 mg/dL (ref 0–200)
Ldl Cholesterol, Calc: 82 mg/dL (ref 0–100)
Triglycerides: 149 mg/dL (ref 0–200)
VLDL Cholesterol, Calc: 30 mg/dL (ref 5–40)

## 2013-03-04 LAB — TSH: Thyroid Stimulating Horm: 0.126 u[IU]/mL — ABNORMAL LOW

## 2013-03-05 LAB — BASIC METABOLIC PANEL
Calcium, Total: 8.3 mg/dL — ABNORMAL LOW (ref 8.5–10.1)
Creatinine: 0.65 mg/dL (ref 0.60–1.30)
EGFR (African American): >60
Sodium: 142 mmol/L (ref 136–145)

## 2013-03-05 LAB — CBC WITH DIFFERENTIAL/PLATELET
Basophil #: 0 10*3/uL (ref 0.0–0.1)
Basophil %: 0.1 %
HCT: 32.9 % — ABNORMAL LOW (ref 35.0–47.0)
HGB: 11.5 g/dL — ABNORMAL LOW (ref 12.0–16.0)
Lymphocyte #: 3.1 10*3/uL (ref 1.0–3.6)
Lymphocyte %: 27.9 %
MCH: 32 pg (ref 26.0–34.0)
MCHC: 34.9 g/dL (ref 32.0–36.0)
Monocyte #: 0.5 x10 3/mm (ref 0.2–0.9)
Monocyte %: 4.7 %
Platelet: 173 10*3/uL (ref 150–440)
RDW: 13.6 % (ref 11.5–14.5)

## 2013-03-05 LAB — MAGNESIUM: Magnesium: 1.8 mg/dL

## 2013-03-06 LAB — CK TOTAL AND CKMB (NOT AT ARMC): CK-MB: 0.8 ng/mL (ref 0.5–3.6)

## 2013-03-06 LAB — BASIC METABOLIC PANEL
Anion Gap: 4 — ABNORMAL LOW (ref 7–16)
Calcium, Total: 8.5 mg/dL (ref 8.5–10.1)
EGFR (Non-African Amer.): >60
Glucose: 88 mg/dL (ref 65–99)
Potassium: 3.5 mmol/L (ref 3.5–5.1)
Sodium: 141 mmol/L (ref 136–145)

## 2013-03-08 LAB — CULTURE, BLOOD (SINGLE)

## 2013-04-17 ENCOUNTER — Emergency Department: Payer: Self-pay | Admitting: Emergency Medicine

## 2013-05-06 ENCOUNTER — Inpatient Hospital Stay: Payer: Self-pay | Admitting: Family Medicine

## 2013-05-06 LAB — COMPREHENSIVE METABOLIC PANEL
ALK PHOS: 145 U/L — AB
ALT: 16 U/L (ref 12–78)
Albumin: 3.6 g/dL (ref 3.4–5.0)
Anion Gap: 3 — ABNORMAL LOW (ref 7–16)
BUN: 6 mg/dL — ABNORMAL LOW (ref 7–18)
Bilirubin,Total: 0.3 mg/dL (ref 0.2–1.0)
CALCIUM: 9 mg/dL (ref 8.5–10.1)
CHLORIDE: 108 mmol/L — AB (ref 98–107)
Co2: 25 mmol/L (ref 21–32)
Creatinine: 0.62 mg/dL (ref 0.60–1.30)
Glucose: 135 mg/dL — ABNORMAL HIGH (ref 65–99)
OSMOLALITY: 272 (ref 275–301)
Potassium: 4.5 mmol/L (ref 3.5–5.1)
SGOT(AST): 21 U/L (ref 15–37)
Sodium: 136 mmol/L (ref 136–145)
TOTAL PROTEIN: 7.2 g/dL (ref 6.4–8.2)

## 2013-05-06 LAB — CBC WITH DIFFERENTIAL/PLATELET
BASOS ABS: 0.1 10*3/uL (ref 0.0–0.1)
Basophil %: 0.5 %
Eosinophil #: 3.1 10*3/uL — ABNORMAL HIGH (ref 0.0–0.7)
Eosinophil %: 21.1 %
HCT: 44.2 % (ref 35.0–47.0)
HGB: 14.9 g/dL (ref 12.0–16.0)
LYMPHS ABS: 5.3 10*3/uL — AB (ref 1.0–3.6)
Lymphocyte %: 35.6 %
MCH: 30.8 pg (ref 26.0–34.0)
MCHC: 33.7 g/dL (ref 32.0–36.0)
MCV: 91 fL (ref 80–100)
MONO ABS: 0.7 x10 3/mm (ref 0.2–0.9)
MONOS PCT: 4.5 %
Neutrophil #: 5.7 10*3/uL (ref 1.4–6.5)
Neutrophil %: 38.3 %
PLATELETS: 306 10*3/uL (ref 150–440)
RBC: 4.84 10*6/uL (ref 3.80–5.20)
RDW: 12.7 % (ref 11.5–14.5)
WBC: 14.7 10*3/uL — AB (ref 3.6–11.0)

## 2013-05-06 LAB — DRUG SCREEN, URINE
Amphetamines, Ur Screen: NEGATIVE (ref ?–1000)
BARBITURATES, UR SCREEN: NEGATIVE (ref ?–200)
Benzodiazepine, Ur Scrn: POSITIVE (ref ?–200)
Cannabinoid 50 Ng, Ur ~~LOC~~: NEGATIVE (ref ?–50)
Cocaine Metabolite,Ur ~~LOC~~: POSITIVE (ref ?–300)
MDMA (ECSTASY) UR SCREEN: NEGATIVE (ref ?–500)
METHADONE, UR SCREEN: NEGATIVE (ref ?–300)
Opiate, Ur Screen: POSITIVE (ref ?–300)
Phencyclidine (PCP) Ur S: NEGATIVE (ref ?–25)
TRICYCLIC, UR SCREEN: NEGATIVE (ref ?–1000)

## 2013-05-06 LAB — URINALYSIS, COMPLETE
BACTERIA: NONE SEEN
BILIRUBIN, UR: NEGATIVE
BLOOD: NEGATIVE
GLUCOSE, UR: NEGATIVE mg/dL (ref 0–75)
Granular Cast: 6
Hyaline Cast: 4
KETONE: NEGATIVE
Leukocyte Esterase: NEGATIVE
NITRITE: NEGATIVE
Ph: 5 (ref 4.5–8.0)
Specific Gravity: 1.012 (ref 1.003–1.030)
WBC UR: 1 /HPF (ref 0–5)

## 2013-05-06 LAB — PROTIME-INR
INR: 1
Prothrombin Time: 13.1 secs (ref 11.5–14.7)

## 2013-05-06 LAB — CK: CK, Total: 67 U/L (ref 21–215)

## 2013-05-06 LAB — MAGNESIUM: Magnesium: 2 mg/dL

## 2013-05-06 LAB — PHOSPHORUS: PHOSPHORUS: 6 mg/dL — AB (ref 2.5–4.9)

## 2013-05-07 LAB — CBC WITH DIFFERENTIAL/PLATELET
Basophil #: 0 10*3/uL (ref 0.0–0.1)
Basophil %: 0.1 %
EOS PCT: 0.2 %
Eosinophil #: 0 10*3/uL (ref 0.0–0.7)
HCT: 37.7 % (ref 35.0–47.0)
HGB: 12.9 g/dL (ref 12.0–16.0)
LYMPHS ABS: 0.8 10*3/uL — AB (ref 1.0–3.6)
Lymphocyte %: 7.7 %
MCH: 31.8 pg (ref 26.0–34.0)
MCHC: 34.2 g/dL (ref 32.0–36.0)
MCV: 93 fL (ref 80–100)
MONO ABS: 0.1 x10 3/mm — AB (ref 0.2–0.9)
Monocyte %: 1.1 %
NEUTROS ABS: 9.7 10*3/uL — AB (ref 1.4–6.5)
Neutrophil %: 90.9 %
Platelet: 219 10*3/uL (ref 150–440)
RBC: 4.06 10*6/uL (ref 3.80–5.20)
RDW: 12.8 % (ref 11.5–14.5)
WBC: 10.7 10*3/uL (ref 3.6–11.0)

## 2013-05-07 LAB — BASIC METABOLIC PANEL
Anion Gap: 4 — ABNORMAL LOW (ref 7–16)
BUN: 8 mg/dL (ref 7–18)
CO2: 22 mmol/L (ref 21–32)
CREATININE: 0.89 mg/dL (ref 0.60–1.30)
Calcium, Total: 8 mg/dL — ABNORMAL LOW (ref 8.5–10.1)
Chloride: 106 mmol/L (ref 98–107)
EGFR (African American): >60
Glucose: 137 mg/dL — ABNORMAL HIGH (ref 65–99)
OSMOLALITY: 265 (ref 275–301)
POTASSIUM: 5.2 mmol/L — AB (ref 3.5–5.1)
Sodium: 132 mmol/L — ABNORMAL LOW (ref 136–145)

## 2013-05-08 LAB — BASIC METABOLIC PANEL
Anion Gap: 3 — ABNORMAL LOW (ref 7–16)
BUN: 12 mg/dL (ref 7–18)
CREATININE: 0.83 mg/dL (ref 0.60–1.30)
Calcium, Total: 8.5 mg/dL (ref 8.5–10.1)
Chloride: 109 mmol/L — ABNORMAL HIGH (ref 98–107)
Co2: 24 mmol/L (ref 21–32)
EGFR (African American): >60
Glucose: 135 mg/dL — ABNORMAL HIGH (ref 65–99)
OSMOLALITY: 274 (ref 275–301)
Potassium: 5.8 mmol/L — ABNORMAL HIGH (ref 3.5–5.1)
SODIUM: 136 mmol/L (ref 136–145)

## 2013-05-08 LAB — POTASSIUM: Potassium: 5.3 mmol/L — ABNORMAL HIGH (ref 3.5–5.1)

## 2013-05-09 LAB — BASIC METABOLIC PANEL
Anion Gap: 6 — ABNORMAL LOW (ref 7–16)
BUN: 22 mg/dL — AB (ref 7–18)
CALCIUM: 8.4 mg/dL — AB (ref 8.5–10.1)
CREATININE: 0.74 mg/dL (ref 0.60–1.30)
Chloride: 107 mmol/L (ref 98–107)
Co2: 23 mmol/L (ref 21–32)
EGFR (Non-African Amer.): >60
GLUCOSE: 135 mg/dL — AB (ref 65–99)
OSMOLALITY: 277 (ref 275–301)
Potassium: 4.9 mmol/L (ref 3.5–5.1)
Sodium: 136 mmol/L (ref 136–145)

## 2013-05-09 LAB — EXPECTORATED SPUTUM ASSESSMENT W GRAM STAIN, RFLX TO RESP C

## 2013-05-10 LAB — MAGNESIUM: Magnesium: 2.4 mg/dL

## 2013-05-10 LAB — TRIGLYCERIDES: TRIGLYCERIDES: 227 mg/dL — AB (ref 0–200)

## 2013-05-10 LAB — THEOPHYLLINE LEVEL: Theophylline: 9 ug/mL — ABNORMAL LOW (ref 10.0–20.0)

## 2013-05-10 LAB — PHOSPHORUS: PHOSPHORUS: 2.5 mg/dL (ref 2.5–4.9)

## 2013-05-11 LAB — CBC WITH DIFFERENTIAL/PLATELET
Basophil #: 0 10*3/uL (ref 0.0–0.1)
Basophil %: 0 %
EOS ABS: 0 10*3/uL (ref 0.0–0.7)
Eosinophil %: 0.1 %
HCT: 32.5 % — AB (ref 35.0–47.0)
HGB: 10.9 g/dL — ABNORMAL LOW (ref 12.0–16.0)
LYMPHS PCT: 13 %
Lymphocyte #: 1.2 10*3/uL (ref 1.0–3.6)
MCH: 30.5 pg (ref 26.0–34.0)
MCHC: 33.5 g/dL (ref 32.0–36.0)
MCV: 91 fL (ref 80–100)
MONOS PCT: 5.7 %
Monocyte #: 0.5 x10 3/mm (ref 0.2–0.9)
Neutrophil #: 7.2 10*3/uL — ABNORMAL HIGH (ref 1.4–6.5)
Neutrophil %: 81.2 %
Platelet: 207 10*3/uL (ref 150–440)
RBC: 3.58 10*6/uL — AB (ref 3.80–5.20)
RDW: 12.9 % (ref 11.5–14.5)
WBC: 8.9 10*3/uL (ref 3.6–11.0)

## 2013-05-11 LAB — COMPREHENSIVE METABOLIC PANEL
AST: 12 U/L — AB (ref 15–37)
Albumin: 2.6 g/dL — ABNORMAL LOW (ref 3.4–5.0)
Alkaline Phosphatase: 61 U/L
Anion Gap: 2 — ABNORMAL LOW (ref 7–16)
BUN: 21 mg/dL — ABNORMAL HIGH (ref 7–18)
Bilirubin,Total: 0.3 mg/dL (ref 0.2–1.0)
CALCIUM: 8.2 mg/dL — AB (ref 8.5–10.1)
CO2: 29 mmol/L (ref 21–32)
Chloride: 107 mmol/L (ref 98–107)
Creatinine: 0.61 mg/dL (ref 0.60–1.30)
EGFR (African American): >60
GLUCOSE: 129 mg/dL — AB (ref 65–99)
Osmolality: 280 (ref 275–301)
POTASSIUM: 4.2 mmol/L (ref 3.5–5.1)
SGPT (ALT): 11 U/L — ABNORMAL LOW (ref 12–78)
Sodium: 138 mmol/L (ref 136–145)
TOTAL PROTEIN: 5.6 g/dL — AB (ref 6.4–8.2)

## 2013-05-11 LAB — CULTURE, BLOOD (SINGLE)

## 2013-05-12 LAB — CBC WITH DIFFERENTIAL/PLATELET
Basophil #: 0 10*3/uL (ref 0.0–0.1)
Basophil %: 0.1 %
Eosinophil #: 0 10*3/uL (ref 0.0–0.7)
Eosinophil %: 0.1 %
HCT: 35.3 % (ref 35.0–47.0)
HGB: 11.9 g/dL — AB (ref 12.0–16.0)
Lymphocyte #: 1.7 10*3/uL (ref 1.0–3.6)
Lymphocyte %: 15.8 %
MCH: 30.9 pg (ref 26.0–34.0)
MCHC: 33.8 g/dL (ref 32.0–36.0)
MCV: 91 fL (ref 80–100)
MONOS PCT: 6.3 %
Monocyte #: 0.7 x10 3/mm (ref 0.2–0.9)
NEUTROS ABS: 8.5 10*3/uL — AB (ref 1.4–6.5)
NEUTROS PCT: 77.7 %
PLATELETS: 210 10*3/uL (ref 150–440)
RBC: 3.87 10*6/uL (ref 3.80–5.20)
RDW: 12.7 % (ref 11.5–14.5)
WBC: 11 10*3/uL (ref 3.6–11.0)

## 2013-05-12 LAB — BASIC METABOLIC PANEL
Anion Gap: 2 — ABNORMAL LOW (ref 7–16)
BUN: 19 mg/dL — ABNORMAL HIGH (ref 7–18)
CHLORIDE: 103 mmol/L (ref 98–107)
CO2: 34 mmol/L — AB (ref 21–32)
CREATININE: 0.48 mg/dL — AB (ref 0.60–1.30)
Calcium, Total: 8.5 mg/dL (ref 8.5–10.1)
EGFR (African American): >60
Glucose: 128 mg/dL — ABNORMAL HIGH (ref 65–99)
OSMOLALITY: 281 (ref 275–301)
POTASSIUM: 4 mmol/L (ref 3.5–5.1)
SODIUM: 139 mmol/L (ref 136–145)

## 2013-05-13 LAB — CBC WITH DIFFERENTIAL/PLATELET
Basophil #: 0.1 10*3/uL (ref 0.0–0.1)
Basophil %: 0.8 %
Eosinophil #: 0 10*3/uL (ref 0.0–0.7)
Eosinophil %: 0.1 %
HCT: 35.7 % (ref 35.0–47.0)
HGB: 12 g/dL (ref 12.0–16.0)
LYMPHS ABS: 1.9 10*3/uL (ref 1.0–3.6)
LYMPHS PCT: 13.3 %
MCH: 30.5 pg (ref 26.0–34.0)
MCHC: 33.5 g/dL (ref 32.0–36.0)
MCV: 91 fL (ref 80–100)
MONOS PCT: 6.8 %
Monocyte #: 1 x10 3/mm — ABNORMAL HIGH (ref 0.2–0.9)
Neutrophil #: 11.3 10*3/uL — ABNORMAL HIGH (ref 1.4–6.5)
Neutrophil %: 79 %
PLATELETS: 225 10*3/uL (ref 150–440)
RBC: 3.92 10*6/uL (ref 3.80–5.20)
RDW: 12.7 % (ref 11.5–14.5)
WBC: 14.3 10*3/uL — AB (ref 3.6–11.0)

## 2013-05-13 LAB — BASIC METABOLIC PANEL
BUN: 20 mg/dL — ABNORMAL HIGH (ref 7–18)
CO2: 36 mmol/L — AB (ref 21–32)
Calcium, Total: 7.9 mg/dL — ABNORMAL LOW (ref 8.5–10.1)
Chloride: 104 mmol/L (ref 98–107)
Creatinine: 0.6 mg/dL (ref 0.60–1.30)
EGFR (Non-African Amer.): >60
Glucose: 116 mg/dL — ABNORMAL HIGH (ref 65–99)
Osmolality: 281 (ref 275–301)
Potassium: 4.2 mmol/L (ref 3.5–5.1)
SODIUM: 139 mmol/L (ref 136–145)

## 2013-05-13 LAB — EXPECTORATED SPUTUM ASSESSMENT W GRAM STAIN, RFLX TO RESP C

## 2013-05-13 LAB — MAGNESIUM: Magnesium: 1.9 mg/dL

## 2013-05-13 LAB — PHOSPHORUS: PHOSPHORUS: 3.5 mg/dL (ref 2.5–4.9)

## 2013-05-14 LAB — BASIC METABOLIC PANEL
Anion Gap: 3 — ABNORMAL LOW (ref 7–16)
BUN: 17 mg/dL (ref 7–18)
CREATININE: 0.5 mg/dL — AB (ref 0.60–1.30)
Calcium, Total: 8.5 mg/dL (ref 8.5–10.1)
Chloride: 104 mmol/L (ref 98–107)
Co2: 31 mmol/L (ref 21–32)
EGFR (Non-African Amer.): >60
Glucose: 82 mg/dL (ref 65–99)
Osmolality: 276 (ref 275–301)
POTASSIUM: 3.6 mmol/L (ref 3.5–5.1)
SODIUM: 138 mmol/L (ref 136–145)

## 2013-05-14 LAB — MAGNESIUM: Magnesium: 1.9 mg/dL

## 2013-05-14 LAB — PHOSPHORUS: PHOSPHORUS: 2.9 mg/dL (ref 2.5–4.9)

## 2013-05-15 LAB — BASIC METABOLIC PANEL
Anion Gap: 7 (ref 7–16)
BUN: 16 mg/dL (ref 7–18)
CALCIUM: 8.2 mg/dL — AB (ref 8.5–10.1)
Chloride: 106 mmol/L (ref 98–107)
Co2: 27 mmol/L (ref 21–32)
Creatinine: 0.4 mg/dL — ABNORMAL LOW (ref 0.60–1.30)
EGFR (African American): >60
EGFR (Non-African Amer.): >60
Glucose: 85 mg/dL (ref 65–99)
Osmolality: 280 (ref 275–301)
Potassium: 3.1 mmol/L — ABNORMAL LOW (ref 3.5–5.1)
Sodium: 140 mmol/L (ref 136–145)

## 2013-05-15 LAB — PHOSPHORUS: Phosphorus: 3.7 mg/dL (ref 2.5–4.9)

## 2013-05-15 LAB — CLOSTRIDIUM DIFFICILE(ARMC)

## 2013-05-15 LAB — MAGNESIUM: Magnesium: 1.9 mg/dL

## 2013-05-18 LAB — CBC WITH DIFFERENTIAL/PLATELET
Basophil #: 0 10*3/uL (ref 0.0–0.1)
Basophil %: 0.2 %
Eosinophil #: 0 10*3/uL (ref 0.0–0.7)
Eosinophil %: 0.1 %
HCT: 40.3 % (ref 35.0–47.0)
HGB: 13.4 g/dL (ref 12.0–16.0)
LYMPHS PCT: 14.3 %
Lymphocyte #: 2.9 10*3/uL (ref 1.0–3.6)
MCH: 30.5 pg (ref 26.0–34.0)
MCHC: 33.2 g/dL (ref 32.0–36.0)
MCV: 92 fL (ref 80–100)
Monocyte #: 0.8 x10 3/mm (ref 0.2–0.9)
Monocyte %: 3.8 %
Neutrophil #: 16.6 10*3/uL — ABNORMAL HIGH (ref 1.4–6.5)
Neutrophil %: 81.6 %
Platelet: 216 10*3/uL (ref 150–440)
RBC: 4.38 10*6/uL (ref 3.80–5.20)
RDW: 13 % (ref 11.5–14.5)
WBC: 20.3 10*3/uL — ABNORMAL HIGH (ref 3.6–11.0)

## 2013-05-18 LAB — BASIC METABOLIC PANEL
ANION GAP: 8 (ref 7–16)
BUN: 8 mg/dL (ref 7–18)
CO2: 25 mmol/L (ref 21–32)
CREATININE: 0.56 mg/dL — AB (ref 0.60–1.30)
Calcium, Total: 8.8 mg/dL (ref 8.5–10.1)
Chloride: 105 mmol/L (ref 98–107)
EGFR (African American): >60
EGFR (Non-African Amer.): >60
GLUCOSE: 146 mg/dL — AB (ref 65–99)
Osmolality: 277 (ref 275–301)
Potassium: 3.6 mmol/L (ref 3.5–5.1)
Sodium: 138 mmol/L (ref 136–145)

## 2013-09-02 ENCOUNTER — Inpatient Hospital Stay: Payer: Self-pay | Admitting: Family Medicine

## 2013-09-02 LAB — CBC
HCT: 37.6 % (ref 35.0–47.0)
HGB: 12.7 g/dL (ref 12.0–16.0)
MCH: 30.8 pg (ref 26.0–34.0)
MCHC: 33.7 g/dL (ref 32.0–36.0)
MCV: 91 fL (ref 80–100)
Platelet: 178 10*3/uL (ref 150–440)
RBC: 4.11 10*6/uL (ref 3.80–5.20)
RDW: 13.1 % (ref 11.5–14.5)
WBC: 6.6 10*3/uL (ref 3.6–11.0)

## 2013-09-02 LAB — BASIC METABOLIC PANEL
Anion Gap: 4 — ABNORMAL LOW (ref 7–16)
BUN: 14 mg/dL (ref 7–18)
CALCIUM: 8.6 mg/dL (ref 8.5–10.1)
Chloride: 106 mmol/L (ref 98–107)
Co2: 28 mmol/L (ref 21–32)
Creatinine: 0.56 mg/dL — ABNORMAL LOW (ref 0.60–1.30)
EGFR (African American): >60
EGFR (Non-African Amer.): >60
Glucose: 103 mg/dL — ABNORMAL HIGH (ref 65–99)
OSMOLALITY: 276 (ref 275–301)
Potassium: 4 mmol/L (ref 3.5–5.1)
Sodium: 138 mmol/L (ref 136–145)

## 2013-09-02 LAB — TROPONIN I: Troponin-I: <0.02 ng/mL

## 2013-09-03 LAB — BASIC METABOLIC PANEL
Anion Gap: 9 (ref 7–16)
BUN: 13 mg/dL (ref 7–18)
Calcium, Total: 8.9 mg/dL (ref 8.5–10.1)
Chloride: 107 mmol/L (ref 98–107)
Co2: 23 mmol/L (ref 21–32)
Creatinine: 0.95 mg/dL (ref 0.60–1.30)
EGFR (African American): >60
EGFR (Non-African Amer.): >60
Glucose: 187 mg/dL — ABNORMAL HIGH (ref 65–99)
Osmolality: 283 (ref 275–301)
Potassium: 4 mmol/L (ref 3.5–5.1)
Sodium: 139 mmol/L (ref 136–145)

## 2013-09-03 LAB — CBC WITH DIFFERENTIAL/PLATELET
BASOS PCT: 0.1 %
Basophil #: 0 10*3/uL (ref 0.0–0.1)
EOS PCT: 0 %
Eosinophil #: 0 10*3/uL (ref 0.0–0.7)
HCT: 39 % (ref 35.0–47.0)
HGB: 13 g/dL (ref 12.0–16.0)
Lymphocyte #: 1.2 10*3/uL (ref 1.0–3.6)
Lymphocyte %: 20.9 %
MCH: 30.5 pg (ref 26.0–34.0)
MCHC: 33.3 g/dL (ref 32.0–36.0)
MCV: 92 fL (ref 80–100)
MONOS PCT: 3.6 %
Monocyte #: 0.2 x10 3/mm (ref 0.2–0.9)
NEUTROS ABS: 4.4 10*3/uL (ref 1.4–6.5)
Neutrophil %: 75.4 %
PLATELETS: 183 10*3/uL (ref 150–440)
RBC: 4.25 10*6/uL (ref 3.80–5.20)
RDW: 13.1 % (ref 11.5–14.5)
WBC: 5.8 10*3/uL (ref 3.6–11.0)

## 2013-09-20 ENCOUNTER — Emergency Department: Payer: Self-pay | Admitting: Emergency Medicine

## 2013-09-20 LAB — BASIC METABOLIC PANEL
Anion Gap: 8 (ref 7–16)
BUN: 10 mg/dL (ref 7–18)
Calcium, Total: 8.8 mg/dL (ref 8.5–10.1)
Chloride: 106 mmol/L (ref 98–107)
Co2: 24 mmol/L (ref 21–32)
Creatinine: 0.56 mg/dL — ABNORMAL LOW (ref 0.60–1.30)
EGFR (African American): >60
EGFR (Non-African Amer.): >60
Glucose: 87 mg/dL (ref 65–99)
Osmolality: 274 (ref 275–301)
Potassium: 3.8 mmol/L (ref 3.5–5.1)
Sodium: 138 mmol/L (ref 136–145)

## 2013-09-20 LAB — URINALYSIS, COMPLETE
BACTERIA: NEGATIVE
BILIRUBIN, UR: NEGATIVE
BLOOD: NEGATIVE
GLUCOSE, UR: NEGATIVE mg/dL (ref 0–75)
LEUKOCYTE ESTERASE: NEGATIVE
NITRITE: NEGATIVE
Ph: 6 (ref 4.5–8.0)
Protein: 100
Specific Gravity: 1.023 (ref 1.003–1.030)

## 2013-09-20 LAB — PHOSPHORUS: Phosphorus: 2.8 mg/dL (ref 2.5–4.9)

## 2013-09-20 LAB — DRUG SCREEN, URINE
Amphetamines, Ur Screen: NEGATIVE (ref ?–1000)
Barbiturates, Ur Screen: NEGATIVE (ref ?–200)
Benzodiazepine, Ur Scrn: NEGATIVE (ref ?–200)
Cannabinoid 50 Ng, Ur ~~LOC~~: NEGATIVE (ref ?–50)
Cocaine Metabolite,Ur ~~LOC~~: NEGATIVE (ref ?–300)
MDMA (Ecstasy)Ur Screen: NEGATIVE (ref ?–500)
Methadone, Ur Screen: NEGATIVE (ref ?–300)
Opiate, Ur Screen: POSITIVE (ref ?–300)
Phencyclidine (PCP) Ur S: NEGATIVE (ref ?–25)
Tricyclic, Ur Screen: NEGATIVE (ref ?–1000)

## 2013-09-20 LAB — CBC
HCT: 42.4 % (ref 35.0–47.0)
HGB: 14.6 g/dL (ref 12.0–16.0)
MCH: 30.7 pg (ref 26.0–34.0)
MCHC: 34.5 g/dL (ref 32.0–36.0)
MCV: 89 fL (ref 80–100)
Platelet: 191 10*3/uL (ref 150–440)
RBC: 4.76 10*6/uL (ref 3.80–5.20)
RDW: 13.3 % (ref 11.5–14.5)
WBC: 8.3 10*3/uL (ref 3.6–11.0)

## 2013-09-20 LAB — MAGNESIUM: Magnesium: 2.2 mg/dL

## 2013-12-28 ENCOUNTER — Emergency Department: Payer: Self-pay | Admitting: Student

## 2014-03-05 ENCOUNTER — Emergency Department: Payer: Self-pay | Admitting: Emergency Medicine

## 2014-03-05 LAB — CBC WITH DIFFERENTIAL/PLATELET
Basophil #: 0 10*3/uL (ref 0.0–0.1)
Basophil %: 0.4 %
EOS ABS: 0.2 10*3/uL (ref 0.0–0.7)
Eosinophil %: 3.3 %
HCT: 44.1 % (ref 35.0–47.0)
HGB: 15.2 g/dL (ref 12.0–16.0)
LYMPHS ABS: 2.5 10*3/uL (ref 1.0–3.6)
Lymphocyte %: 37.3 %
MCH: 31.6 pg (ref 26.0–34.0)
MCHC: 34.5 g/dL (ref 32.0–36.0)
MCV: 92 fL (ref 80–100)
MONOS PCT: 7.2 %
Monocyte #: 0.5 x10 3/mm (ref 0.2–0.9)
Neutrophil #: 3.4 10*3/uL (ref 1.4–6.5)
Neutrophil %: 51.8 %
Platelet: 197 10*3/uL (ref 150–440)
RBC: 4.81 10*6/uL (ref 3.80–5.20)
RDW: 13.4 % (ref 11.5–14.5)
WBC: 6.6 10*3/uL (ref 3.6–11.0)

## 2014-03-05 LAB — COMPREHENSIVE METABOLIC PANEL
ANION GAP: 9 (ref 7–16)
Albumin: 3.5 g/dL (ref 3.4–5.0)
Alkaline Phosphatase: 77 U/L
BUN: 9 mg/dL (ref 7–18)
Bilirubin,Total: 0.2 mg/dL (ref 0.2–1.0)
CREATININE: 0.68 mg/dL (ref 0.60–1.30)
Calcium, Total: 8.5 mg/dL (ref 8.5–10.1)
Chloride: 109 mmol/L — ABNORMAL HIGH (ref 98–107)
Co2: 23 mmol/L (ref 21–32)
EGFR (African American): >60
GLUCOSE: 89 mg/dL (ref 65–99)
OSMOLALITY: 279 (ref 275–301)
Potassium: 4.4 mmol/L (ref 3.5–5.1)
SGOT(AST): 19 U/L (ref 15–37)
SGPT (ALT): 14 U/L
Sodium: 141 mmol/L (ref 136–145)
TOTAL PROTEIN: 6.9 g/dL (ref 6.4–8.2)

## 2014-03-05 LAB — URINALYSIS, COMPLETE
Bacteria: NONE SEEN
Bilirubin,UR: NEGATIVE
Blood: NEGATIVE
Glucose,UR: NEGATIVE mg/dL (ref 0–75)
KETONE: NEGATIVE
Leukocyte Esterase: NEGATIVE
Nitrite: NEGATIVE
Ph: 7 (ref 4.5–8.0)
Protein: NEGATIVE
RBC,UR: <1 /HPF (ref 0–5)
SQUAMOUS EPITHELIAL: NONE SEEN
Specific Gravity: 1.009 (ref 1.003–1.030)
WBC UR: <1 /HPF (ref 0–5)

## 2014-03-05 LAB — LIPASE, BLOOD: LIPASE: 79 U/L (ref 73–393)

## 2014-08-18 NOTE — H&P (Signed)
PATIENT NAME:  [name], [name]#:  [id number] DATE OF BIRTH:  1965/07/15  DATE OF ADMISSION:  03/03/2013  PRIMARY CARE PHYSICIAN: Dr. Mariana [name]  CHIEF COMPLAINT: Unresponsiveness.   HISTORY OF PRESENT ILLNESS: The patient is a 49 year old Caucasian female with past medical history of COPD, polysubstance abuse including cocaine, chronic low back pain and, history of renal insufficiency who was brought into the ER after she became unresponsive. According to the ER physician's history, the patient was recently released from the jail and around 10:00 p.m. last night as she had an argument with her husband and left home. Following that, at around 4:00 in the morning, she knocked on the brother's door and when he opened the door she became unresponsive and fell on the floor. The patient had CPR done by fireman and eventually she was brought into the ER. On arrival ER, the patient was unresponsive with a pulse of 119, respirations 10 and blood pressure 134/99. The patient was intubated by the ER physician, Dr. Dolores [name], for airway protection. The patient's initial ABG has revealed a pH of 7.01, pCO2 97 and pO2 125. Following intubation, the patient was started on propofol, fentanyl and Versed drips. The patient is currently getting IV fluid boluses. EKG has revealed wide-complex tachycardia and left bundle branch block regarding which the patient has received bicarb, IV calcium and magnesium, and the ER physician, Dr. Dolores [name], has discussed this with Dr. Gwen [name] who has recommended to give the patient amiodarone bolus followed by amiodarone drip. As the patient has cocaine in her urine drug screen, he has recommended not to anticoagulate the patient with either heparin or Lovenox as that might trigger intracranial hemorrhage. During my examination, the patient is intubated and I was unable to get any history from the patient. History is obtained from the ER staff. The patient's home owner is next to her and he is not related  to her, but he is reporting that her mother is on her way to the ER. Husband is not at bedside. There is a concern for suicidal attempt. In view of polysubstance abuse, the patient was made IVC by the ER physician. Blood cultures and urine cultures were ordered and the patient will be given empiric antibiotics as there is a concern for aspiration.   PAST MEDICAL HISTORY: COPD, tobacco use disorder, polysubstance abuse including cocaine, anxiety and benzo dependent,  chronic low back pain and renal insufficiency.   PAST SURGICAL HISTORY: The patient was intubated in the past, but no significant surgical history.   ALLERGIES:  AMOXICILLIN AND SULFA.   PSYCHOSOCIAL HISTORY: Lives at home with husband. Recently was released from jail as reported by the ER staff. Still smokes half pack a day. No history of alcohol, according to the old records, but positive for substance abuse.   FAMILY HISTORY: Hypertension and heart disease runs in her family.   REVIEW OF SYSTEMS: Unobtainable.  HOME MEDICATIONS:  1.  Tramadol 50 mg 2 tablets p.o. q. 8 hours.  2.  Spiriva 18 mcg one inhalation once daily. 3.  ProAir 2 puffs inhalation every 6 hours as needed. 4.  Prednisone 10 mg tapering dose. 5.  Levofloxacin 750 mg once daily. 6.  Guaifenesin 10 mL p.o. every 4 hours. 7.  Alprazolam 1 mg p.o. every 6 hours. 8.  Albuterol inhalation every 6 hours as needed for shortness of breath. 9.  Tylenol 1 tablet p.o. q. 4 hours as needed for pain.   PHYSICAL EXAMINATION: VITAL SIGNS: Temperature 97.8, pulse  118, respirations 73, blood pressure 101/79, pulse ox 100% on ventilator with AC mode. GENERAL APPEARANCE: Not in acute distress. The patient is sedated and intubated. HEENT: Normocephalic. Pupils are sluggishly reacting to light and accommodation. Nares are patent. Dry mucous membranes. ET tube is intact. No sinus tenderness.  NECK: Supple. No JVD.  LUNGS: Coarse breath sounds with rales and rhonchi.  HEART:   S1, S2 normal. Regular rate and rhythm.  ABDOMEN: Soft. Bowel sounds are positive in all 4 quadrants.  NEUROLOGIC: The patient is sedated, could not elicit motor and sensory.  EXTREMITIES: No edema. No cyanosis. No clubbing. The patient has a right femoral central line and left EJ.  SKIN: Warm to touch. Normal turgor. No rashes. No lesions.   LABORATORY AND IMAGING STUDIES: Glucose 248, BUN 7, creatinine 1.09, sodium 136, potassium 4.9, chloride 104, CO2 21. GFR 60. Anion gap 11, serum osmolality 278, calcium 8.2. Magnesium 1.8. Lipase 64. Ethanol less than 3. LFTs: Albumin is low at 3. The rest of the LFTs are normal. CK total 74, CPK-MB less than 0.5, troponin 0.02. TSH 1.02. Urine tox screen is positive for benzos, cocaine, opiates. WBC 14.6, hemoglobin 13.7, hematocrit 41.2, platelets 262. PT 15.3. INR 1.3. Urinalysis: Straw-colored, clear in appearance, ketones negative, bili negative, specific gravity 1.005, nitrite and leukocyte esterase are negative. Salicylate level 5.6. Acetaminophen less than 2.  PH 7.041, pCO2 97, pO2 125, FiO2 50%. Bicarb is 24.5. Base excess is -8.8. Vent settings, AC mode, with tidal volume 450, PEEP of 5, rate at 16.   EKG: Sinus tachycardia at 121 beats per minute, normal PR interval, left axis deviation, had branch block.  Portable chest x-ray has revealed low position of endotracheal tube which is 7 mm above the carina, could be withdrawn 2 to 3 mm for optimal placement. No radiographic evidence of acute cardiopulmonary disease. It was discussed with Dr. Dolores Frame.   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 Mild paranasal sinus disease without acute features.   CT of the cervical spine without contrast has revealed no acute fractures are elicited or identified within the cervical spine. Sequela of prior decompressive laminectomies at C3-C6  with posterior spinal fixation. No evidence of hardware complication.  ASSESSMENT AND PLAN: A 49 year old Caucasian female brought into the ER after possible suicidal attempt, polysubstance abuse, was found unresponsive by her brother. Had cardiopulmonary resuscitation done by fireman and brought into the ER. The patient was found with agonal respirations and intubated for airway protection in the ER. Hospitalist team is called to admit the patient.   ASSESSMENT AND PLAN: 1.  Cardiopulmonary arrest with polysubstance abuse. Will admit her to CCU. The patient is intubated on AC mode. Repeat ABGs. We will continue propofol, Versed and fentanyl drips. The patient is made IVC. Has one-on-one sitter. Routine psych consult is placed.  2.  Left bundle branch block. The patient was given amiodarone bolus and amiodarone was started as recommended by Dr. Gwen Pounds. Dr. Gwen Pounds recommended no anticoagulation with heparin or Lovenox as the patient has abused cocaine. There was a concern for intracranial hemorrhage which could be triggered with heparin or Lovenox. Consult is placed with cardiology. Cycle cardiac biomarkers.  3.  Sepsis with possible aspiration. Pan cultures were ordered and the patient will be on empiric antibiotics, aztreonam and levofloxacin. 4.  Hypotension, which could be from sepsis/history of renal insufficiency. The patient was  given stress dose steroids. Will be on IV fluids and boluses. Levophed drip is ordered to titrate to a map of 65. Hypotension could be side effect of propofol and fentanyl drips.  5.  The patient will be getting IV Protonix for gastrointestinal prophylaxis. Deep vein thrombosis prophylaxis with SCDs only as recommended by Dr. Gwen Pounds.   She is FULL CODE. Currently family members are not at the bedside and mom is on her way as reported by the home owner.  TOTAL CRITICAL CARE TIME SPENT: 60 minutes. ____________________________ Ramonita Lab, MD ag:sb D: 03/03/2013  08:07:27 ET T: 03/03/2013 08:37:03 ET JOB#: [id number]  cc: Ramonita Lab, MD, <Dictator> Lamar Blinks, MD Ramonita Lab MD ELECTRONICALLY SIGNED 03/13/2013 8:14

## 2014-08-18 NOTE — Consult Note (Signed)
PATIENT NAME:  [name], [name] MR#:  [id number] DATE OF BIRTH:  1965/08/02  DATE OF CONSULTATION:  03/04/2013  REFERRING PHYSICIAN:   CONSULTING PHYSICIAN:  Audery AmelJohn T. Gilda Abboud, MD  IDENTIFYING INFORMATION AND REASON FOR CONSULT: A 49 year old woman with a history of drug use and mood symptoms in the past. Consultation for potential polysubstance abuse.   HISTORY OF PRESENT ILLNESS: Information obtained primarily from the patient and the chart. The patient came to the hospital unresponsive having passed out at a family member's house. She was not breathing well when she came to the hospital and was briefly intubated. She had a drug screen positive for benzodiazepines, narcotics and cocaine. On interview today, the patient is very uncooperative. She tells me she is in the hospital just because she has trouble breathing. She denied to me that she was using any drugs at all. Denied use of cocaine. Denied use of benzodiazepines. Denied use of narcotics. When I pointed out to her that she had all of these in her drug screen and also suggested that that might make it a good idea to stop the Xanax, she became quite angry and instantly insisted that she would never stop taking Xanax. She was very inconsistent in her history. When I tried to ask her more questions about her substance use, she became angry and told me she wanted me to get out of the room and would not talk to me anymore. Prior to that, she had told me that her mood had been fine. Denied any suicidal ideation. Denied any psychotic symptoms.   PAST PSYCHIATRIC HISTORY: The patient has had previous hospitalizations in which the issue has arisen of her use of benzodiazepines particularly. She also has a history of mood instability. Previously had been responsive to Seroquel, although she tells me now that she does not like Seroquel and will not take it anymore. She has a history of overusing medication probably. Unclear if she has had actual suicide attempts.  She has had psychiatric hospitalizations in the past.   PAST MEDICAL HISTORY: Primarily COPD. Right now, she has just come off of the ventilator. Also appears to have chronic pain or at least is being treated for chronic pain, although exactly what the cause of it is is unclear.   SOCIAL HISTORY: The patient indicated to me that she lives by herself but had family nearby. She seems to have a somewhat chaotic relationship with her family. Does not appear to be working recently. Other details are sketchy.   CURRENT MEDICATIONS: The controlled substances database shows that she has been prescribed Xanax on a b.i.[name]. basis at least or possibly as much as a milligram every 6 hours p.r.n. Also has gotten oxycodone recently. Uses Spiriva, ProAir, levofloxacin, guaifenesin, albuterol inhaler. Unclear to me if she had been on prednisone recently.   ALLERGIES: AMOXICILLIN SUPPOSEDLY, SULFA DRUGS.   REVIEW OF SYSTEMS: The patient says she is feeling tired, feeling rotten and feeling in pain all over. Denies suicidal ideation. Denies homicidal ideation. Denies hallucinations. Says she is feeling like throwing up and has pain in her throat and has a cough.   MENTAL STATUS EXAMINATION: Disheveled woman, looks older than her stated age. Barely cooperative with the interview before she decided to throw me out of the room. Made no eye contact. Psychomotor activity is mostly very withdrawn but at times becomes very agitated, flails around and screams at me before crawling back under her covers. Affect labile. Mood stated as being bad. Thoughts disorganized.  She was unable to stick to a topic. She was disoriented to time. Even after I told her the time twice, she continued to get it wrong. She was oriented to where she was and her current situation. Denied suicidal ideation or homicidal ideation. Denied hallucinations. Did not appear to be responding to internal stimuli.   LABORATORY RESULTS: Admission labs showed a drug  screen positive for cocaine, opiates and benzodiazepines. Chemistry panel: Low calcium, elevated glucose. Troponins were all checked. Does not appear to be having an MI.   ASSESSMENT: A 49 year old woman who has a history of mood disorder and substance abuse. Currently, she is uncooperative with treatment. Quite agitated. Does not, however, appear to be psychotic. I see from checking the database that she does have a local provider who has been prescribing her Xanax and narcotic pain medication. The patient made it clear to me that she had no intention of stopping any of this. She also made it clear to me that she had no intention of even discussing her cocaine use much less discussing any treatment for it. I think, given her presentation, it would be appropriate to try and taper her off of the sedating medications at least while she is in the hospital, and I would suggest not giving her any prescriptions for controlled substances at discharge. I have put orders in for a quick taper using Librium and a low dose of Suboxone. The patient has a history that would suggest a high risk of leaving the hospital AMA. She is not acutely committable. I still would not give her any controlled substances if she does that. The patient has been educated that there are opportunities and resources available for substance abuse treatment if she wishes to use them.   DIAGNOSES, PRINCIPAL AND PRIMARY:  AXIS I:  1. Cocaine abuse.  2. Benzodiazepine and opiate dependence.  3. Mood disorder, not otherwise specified.  AXIS II: Deferred.  AXIS III: Chronic obstructive pulmonary disease, possible pneumonia.  AXIS IV: Moderate to severe legal, financial and personal stress.  AXIS V: Functioning at time of evaluation 35.   TOTAL TIME SPENT ON THE CONSULT: 50 minutes.    ____________________________ Audery Amel, MD jtc:gb [name]: 03/04/2013 23:08:55 ET T: 03/04/2013 23:25:17 ET JOB#: [id number]  cc: Audery Amel, MD,  <Dictator> Audery Amel MD ELECTRONICALLY SIGNED 03/06/2013 21:26

## 2014-08-18 NOTE — Consult Note (Signed)
Brief Consult Note: Diagnosis: polysubstance dependence.   Patient was seen by consultant.   Consult note dictated.   Recommend further assessment or treatment.   Orders entered.   Comments: Psychiatry: Patient seen and chart reviewed. Patient with a history of complications from her chronic use of abusable drugs and this presentation having cocaine in system. PAtient interviewed. She is irritable and uncooperative and has no insight or willingness to discuss the problem. She has been prescribed standing xanax and oxycodone by Dr [name] Savageoobin and I doubt she will be willing to stop but I don't think it is appropriate to continue meds that are potentially dangerous. I have ordered a brief taper of both suboxone and librium to manage withdrawl safely. I would like to discuss SA treatment with her but she threw me out of the room before I could get to it. Patient is at some risk of leaving AMA. I would notencourage prescribing controlled drugs at discharge. I offered Seroquel which had previously been helpful but she refused. Will still order it as a PRN.  Electronic Signatures: Audery Amellapacs, [place] Yarbro T (MD)  (Signed [phone]-Nov-14 19:23)  Authored: Brief Consult Note   Last Updated: 07-Nov-14 19:23 by Audery Amellapacs, Arvo Ealy T (MD)

## 2014-08-18 NOTE — Consult Note (Signed)
PATIENT NAME:  [name], [name] MR#:  [id number] DATE OF BIRTH:  [birth year]  DATE OF CONSULTATION:  03/06/2013  REFERRING PHYSICIAN:    CONSULTING PHYSICIAN:  Raelan Burgoon K. Tanzania Basham, MD  AGE:  49   SEX: Female  RACE: White  SUBJECTIVE:  The patient was seen in her room.  Staff reported that she had been cooperative; however, she continues to complain of pain in her legs.  The patient reports that she will be going home tomorrow, and she will get a followup appointment in the community.    OBJECTIVE: Dressed in hospital clothes. Alert and oriented, calm, pleasant and cooperative.  No agitation.  However, she gets irritable when questions are asked because she is depressed about situation and life and she reports that she has to go away from the current place if she wants to get away from the stuff that she is around.  When drugs are mentioned, she gets upset and frustrated and angry.  She does not appear to be responding to internal stimuli.  Memory is intact.  Denies any ideas or plans to hurt herself or others.  Insight and judgement are guarded.   IMPRESSION:  1.  Polysubstance abuse including cocaine, chronic, continues.   2.  Substance-induced mood disorder.   RECOMMENDATIONS:  Today, I discontinued IVC, and the patient can be discharged when medically cleared and stable back to the community; and she will get followup appointment with psychiatrist for help and will seek substance abuse counseling if she feels the need for the same.    ____________________________ Jannet MantisSurya K. Guss Bundehalla, MD skc:cc [name]: 03/06/2013 15:16:00 ET T: 03/06/2013 19:01:15 ET JOB#: [id number]  cc: Monika SalkSurya K. Guss Bundehalla, MD, <Dictator> Beau FannySURYA K Edgar Corrigan MD ELECTRONICALLY SIGNED 03/07/2013 9:08

## 2014-08-18 NOTE — Consult Note (Signed)
Brief Consult Note: Diagnosis: cocaine overdose; respiratory failure; sinus tachycardia with rate related lbbb.   Patient was seen by consultant.   Recommend further assessment or treatment.   Orders entered.   Comments: Called by ward secretary with urgent consult regarding wide complex tachycardia. Pt is a 49 yo female with history of substance abuse who was admitted with respiratory failure resulting in hypercapnea. Pt noted with have tachycardia with lbbb. Review of ekg suggests sinus tachycardia with rate related lbbb. Currently in sinus tach with normal qrs duration. Pt remains intubated and sedated. Echo pending. Will discontinue amidoarone as pt appears to have sinus tachycardia with rate related sinus tach.  Electronic Signatures: Dalia HeadingFath, Dick Hark A (MD)  (Signed [phone]-Nov-14 10:15)  Authored: Brief Consult Note   Last Updated: 06-Nov-14 10:15 by Dalia HeadingFath, Tyyonna Soucy A (MD)

## 2014-08-18 NOTE — Discharge Summary (Signed)
PATIENT NAME:  Beth ConnersOAKLEY, [name] MR#:  [id number] DATE OF BIRTH:  April 08, 1966  DATE OF ADMISSION:  02/15/2013 DATE OF DISCHARGE:  02/16/2013  PRESENTING COMPLAINT: Shortness of breath and cough.   DISCHARGE DIAGNOSES:  1.  Chronic obstructive pulmonary disease exacerbation.  2.  Acute bronchitis.  3.  Polysubstance abuse.  4.  Ongoing tobacco abuse.   CODE STATUS: Full code.   MEDICATIONS:  1.  Tramadol 50 mg 2 tablets every 8 hours as needed.  2.  Albuterol nebulizer q.6 hours p.r.n.  3.  Acetaminophen/oxycodone 325/5 mg 1 tablet every 4 hours as needed.  4.  Pro-Air HFA 2 puffs every 6 hours.  5.  Spiriva 18 mcg inhalation daily.  6.  Xanax 1 mg every 6 hours as needed.  7.  Prednisone taper.  8.  Levaquin 750 p.o. daily for 4 more days.  9.  Advair 250/50 mg 1 puff b.i.d.  10. Guaifenesin 10 mL every 4 hours as needed.   FOLLOWUP: With Dr. Mariana Kaufmanobin in [place].   DISCHARGE INSTRUCTIONS: The patient advised smoking cessation.   BRIEF SUMMARY OF HOSPITAL COURSE: [name] is a 49 year old Caucasian female with history of COPD and ongoing tobacco abuse along with depression and anxiety. She comes in with:  1.  Acute on chronic COPD exacerbation with bronchitis. She failed outpatient antibiotics of clarithromycin. She was started on IV Solu-Medrol, Advair, Spiriva, nebulizer treatment and albuterol oral inhalers were continued. She was placed on Levaquin. Chest x-ray and CT of the chest are negative for pneumonia and PE. The patient's sats were  95% on room air. She will finish up steroid taper and her course of antibiotics.  2.  Depression and anxiety. Continue Prozac and Xanax.  3.  Tobacco abuse disorder. The patient is advised smoking cessation. She is not ready. She appears to be noncompliant to smoking cessation in the past.  4.  Chronic low back pain. Continue p.r.n. narcotics.  5.  Hospital stay otherwise remained stable.   CODE STATUS: The patient remained a full code.    FOLLOWUP: The patient will follow up with her primary care physician, Dr. Mariana Kaufmanobin.   TIME SPENT: 40 minutes.  ____________________________ Wylie HailSona A. Allena KatzPatel, MD sap:aw D: 02/17/2013 07:27:34 ET T: 02/17/2013 07:56:35 ET JOB#: [id number]  cc: Paulene Tayag A. Allena KatzPatel, MD, <Dictator> Vic RipperPaul C. Mariana Kaufmanobin, MD Willow OraSONA A Inessa Wardrop MD ELECTRONICALLY SIGNED 02/20/2013 10:55

## 2014-08-18 NOTE — Discharge Summary (Signed)
PATIENT NAME:  [name], Brina D MR#:  [id number] DATE OF BIRTH:  1965-08-31  DATE OF ADMISSION:  03/03/2013 DATE OF DISCHARGE:  03/07/2013  DISCHARGE DIAGNOSES:  1.  Polysubstance abuse.  2.  Depression.  3.  Ventilatory dependent respiratory failure.  4.  Acute encephalopathy.  5.  Cardiomyopathy.  6.  Elevated troponin.  7.  Chronic obstructive pulmonary disease.  8.  Noncompliance.   CONSULTANTS:  1.  Dr. Guss Bundehalla and Dr. Toni Amendlapacs with psychiatry.  2.  Dr. Lady GaryFath with cardiology.  3.  Dr. Belia HemanKasa with pulmonology.   IMAGING STUDIES DONE: Include a CT of the head without contrast, which showed no acute abnormalities. It did show old lacunar infarctions in the basal ganglia bilaterally. Chest x-ray showed no acute cardiopulmonary disease. It showed endotracheal tube in place.   ADMITTING HISTORY AND PHYSICAL: Please see detailed H and P dictated previously by Dr. Amado CoeGouru. In brief, a 49 year old Caucasian female patient with past history of COPD, polysubstance abuse, who was brought to the Emergency Room with complaints of unresponsiveness. The patient had CPR done in the field, but had normal blood pressure and heart rate by the time she arrived to the Emergency Room with a wide complex tachycardia for which she was started on amiodarone.  1.  Polysubstance abuse. The patient was intubated secondary to acute encephalopathy from polysubstance abuse, which resolved. She did well on the ventilator and was extubated quickly.  2.  Wide complex tachycardia. The patient was started on amiodarone briefly, which was stopped later once he returned to normal sinus rhythm. Dr. Lady GaryFath of cardiology saw the patient. She did have cardiomyopathy, likely secondary from the polysubstance abuse. No return to the medications are being continued. Troponin did elevate mildly at 2.14, without any NSTEMI. Her wide complex tachycardia was thought to be secondary to cocaine abuse. No further investigation was advised by Dr. Lady GaryFath.  The patient will follow up with him in the office in 1 to 2 weeks.   The patient was seen by psychiatry as there was concern as to if this was an attempt at suicide, but an IVC was discontinued by Dr. Guss Bundehalla and she did clear the patient for discharge back home to follow up with her psychiatrist.   Today, the patient is alert and oriented x 3 and does not have any suicidal ideation or plan and will be discharged back home. Her cardiac examination shows S1, S2, without any murmurs. Lung exam shows to auscultation clear lungs.   DISCHARGE MEDICATIONS: Include:  1.  Albuterol nebulizer every 6 hours as needed for shortness of breath.  2.  Pro-Air HFA 2 puffs inhaled every 6 hours as needed for wheezing.  3.  Spiriva 18 mcg inhaled once a day.  4.  Alprazolam 1 mg oral every 6 hours as needed for anxiety and nervousness. 5.  Fluticasone salmeterol 250/50 mg 1 puff inhaled 2 times a day.  6.  Prednisone 60 mg tapered over 6 days. 7 . Lisinopril 2.5 mg oral once a day.  8.  Seroquel 50 mg oral once a day at bedtime.   DISCHARGE INSTRUCTIONS: Low-sodium diet. Activity as tolerated. Follow up with psychiatry in 1 to 2 weeks and Dr. Lady GaryFath in 1 to 2 weeks.   Time spent on day of discharge in discharge activity was 35 minutes.  ____________________________ Molinda BailiffSrikar R. Adamarys Shall, MD srs:aw D: 03/07/2013 14:43:34 ET T: 03/07/2013 15:01:56 ET JOB#: [id number]  cc: Wardell HeathSrikar R. Alizah Sills, MD, <Dictator> Orie FishermanSRIKAR R Tanzania Basham MD ELECTRONICALLY SIGNED  03/14/2013 20:44 

## 2014-08-18 NOTE — H&P (Signed)
PATIENT NAME:  [name], [name] MR#:  [id number] DATE OF BIRTH:  [birth year]  DATE OF ADMISSION:  02/15/2013  ADMITTING PHYSICIAN:  Gladstone Lighter, MD    PRIMARY PHYSICIAN:  Dr. Petra Kuba  CHIEF COMPLAINT:  Altered mental status and worsening dyspnea on exertion, patient brought from jail.   HISTORY OF PRESENT ILLNESS:  [name] is a 49 year old Caucasian female with past medical history significant for chronic depression and anxiety, COPD and tobacco use disorder, and polysubstance abuse, including cocaine. She was brought in from prison secondary to worsening shortness of breath and altered mental status. The patient's mental status was back to baseline at the time of my examination. She did have the dyspnea on exertion and minimum hypoxia initially, but improved quickly after neb treatments. But she did have significant wheezing, and was being admitted for COPD exacerbation. She continues to smoke about 1/2 pack per day. She is just released from the jail during my exam, so she is no longer a prisoner at this point. Her urine tox was positive for cocaine and benzos, and she is chronically dependent on Xanax. She complains of being ill for more than 2 months. Denies any recent travel. She always had some cough secondary to COPD, but that was worse with mucoid phlegm production lately. Denies any fevers, positive for chills, and her breathing was gradually getting worse, according to the patient.   PAST MEDICAL HISTORY: 1.  COPD.   2.  Tobacco use disorder.  3.  Polysubstance abuse, including cocaine.  4.  Anxiety and benzo dependent.  5.  Chronic low back pain.  6.  Adrenal insufficiency.   PAST SURGICAL HISTORY:  History of being on the vent, but no significant surgical history.   ALLERGIES TO MEDICATIONS:  SULFA  and AMOXICILLIN.   CURRENT HOME MEDICATIONS:  The patient claims that she is on  1.  Spiriva 1 inhalation daily.  2.  Percocet 5/325 mg 1 tablet q. 4 hours p.r.n. for pain.  3.   Albuterol nebulizer 3 mL q. 6 hours p.r.n. for shortness of breath.  4.  Xanax 1 mg p.o. q. 6 hours p.r.n. for anxiety.  5.  Clarithromycin  5 mL b.i.d. for 10 days, which she finished off.  6.  ProAir inhaler p.r.n. for wheezing.  7.  Tramadol 100 mg q. 8 hours p.r.n. for pain.   SOCIAL HISTORY:  Lives at home by herself. Continues to smoke about 1/2 pack per day. No alcohol use.   FAMILY HISTORY:  Positive for hypertension and heart disease.   REVIEW OF SYSTEMS:    CONSTITUTIONAL: No fevers. Positive for chills. No fatigue or weakness.  EYES: No blurred vision, double vision, glaucoma or cataracts.  ENT: No tinnitus, ear pain, hearing loss, epistaxis or discharge.  RESPIRATORY: Positive for cough, wheezing. No hemoptysis. Positive for COPD.  CARDIOVASCULAR: No chest pain, orthopnea, edema, arrhythmia, palpitations or syncope. GASTROINTESTINAL: No nausea, vomiting, diarrhea, abdominal pain, hematemesis or melena.  GENITOURINARY:  No dysuria, hematuria, renal calculus, frequency or incontinence.  ENDOCRINE: No polyuria, nocturia, thyroid problems, heat or cold intolerance.  HEMATOLOGY:  No anemia, easy bruising or bleeding.  SKIN:  No acne, rash or lesions.  MUSCULOSKELETAL:  No neck, back, shoulder pain, arthritis or gout.  NEUROLOGIC:  No numbness, weakness, CVA, TIA or seizures.  PSYCHOLOGICAL:  Positive history of anxiety. No insomnia or depression.   PHYSICAL EXAMINATION:  VITAL SIGNS:  Temperature 96.2 degrees Fahrenheit, pulse 110, respirations 30, blood pressure 124/84, pulse ox 96%  on room air.  GENERAL EXAM:  Well-built, well-nourished female lying in bed, not in any acute distress.  HEENT: Normocephalic, atraumatic. Pupils equal, round, reacting to light. Anicteric sclerae. Extraocular movements intact. Oropharynx clear, without erythema, mass or exudates.  NECK: Supple. No thyromegaly, JVD, or carotid bruits. No lymphadenopathy. Full range of motion of neck, without pain.   LUNGS:  Moving air bilaterally. Fine expiratory wheeze. No crackles. No use of accessory muscles for breathing.  CARDIOVASCULAR:  S1, S2. Regular rate and rhythm. No murmurs, rubs or gallops.  ABDOMEN:  Soft, nontender, nondistended. No hepatosplenomegaly. Normal bowel sounds.  EXTREMITIES:  No pedal edema. No clubbing or cyanosis. There are 2+ dorsalis pedis pulses palpable bilaterally.  SKIN:  No acne, rash or lesions. LYMPHATIC:  No cervical or inguinal lymphadenopathy.  NEUROLOGIC:  Cranial nerves II through XII remain intact. Motor strength is 5/5, all 4 extremities. Sensation intact.  PSYCHOLOGICAL:  The patient is awake, alert, oriented x 3.   LAB DATA:  WBC 15.6, hemoglobin 15.6, hematocrit 45.3, platelet count is 299.  Sodium 131, potassium 4.7, chloride 102, bicarb 18, BUN 6, creatinine 0.41, glucose 113, and calcium of 8.7.  ALT 17, AST 19, alk phos 129, total bili 0.5, albumin of 3.9. Troponin mildly elevated at 0.07. Tylenol level is negative. Alcohol level is negative.   CT of the head without contrast done for altered mental status showing no acute changes. Old lacunar infarction in right basal ganglia. Subtle density in left basal ganglia, which are old or stable. Chest x-ray on admission showing no acute cardiopulmonary disease. CT of the chest done for exertional hypoxia showing no evidence of acute PE, CHF, or  acute lung pathology. No pleural or pericardial effusion. Urine tox screen positive for opiates and cocaine and benzos. EKG showing sinus tachycardia, heart rate of 107, no acute ST-T wave abnormalities.   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   1.  Acute on chronic COPD exacerbation with bronchitis. Failed outpatient antibiotics of clarithromycin. Started on IV  Solu-Medrol. Continue Advair, Spiriva, and neb treatments. Started on Levaquin. Chest x-ray and CT chest are negative. Continue oxygen support, and wean as tolerated.  2.  Depression/anxiety. Continue Prozac and Xanax.  3.  Tobacco use disorder. Counseled for 3 minutes. Not ready for quitting. 4.  Chronic low back pain. Continue Vicodin p.r.n.  5. Gastrointestinal and deep vein thrombosis prophylaxis with Protonix and Lovenox, respectively.   CODE STATUS:  FULL CODE.   Time spent on admission is 50 minutes.    ____________________________ Gladstone Lighter, MD rk:mr D: 02/15/2013 19:24:42 ET T: 02/15/2013 19:49:14 ET JOB#: [id number]  cc: Gladstone Lighter, MD, <Dictator> Gladstone Lighter MD ELECTRONICALLY SIGNED 02/16/2013 14:25

## 2014-08-18 NOTE — Consult Note (Signed)
PATIENT NAME:  [name], Elayah D MR#:  [id number] DATE OF BIRTH:  [birth year]  AGE:  49 years  SEX:  Female  RACE:  White  DATE OF CONSULTATION:  03/05/2013  PLACE OF DICTATION:  [facility], room #147, [place], [place]  CONSULTING PHYSICIAN:  Kynsie Falkner K. Daeshawn Redmann, MD  SUBJECTIVE:  The patient was seen in consultation in room #147. The patient is a 49 year old white female, not employed, and last was in 2009. She reports that she quit working because she wanted to go on disability for back and neck surgery and problems related to the same. The patient is separated for one year after being married for 5 years. The patient lives with her mother, who is in her 4860s. According to information obtained from the staff, the patient went to visit her brother and she passed out, and they got concerned and got the ambulance. They did not know what the reason was until they came to the [facility] Emergency Room, and came to know that she was on some street drugs and overdosed on the same and passed out on the same. According to information obtained from the chart, the patient had polysubstance abuse, including cocaine, which she took too much and passed out. When the patient was asked, she reported "I don't know, probably so, that is what they say."   PAST PSYCHIATRIC HISTORY:  The patient reports that she was never admitted to inpatient psychiatry. No history of suicide attempts. Being [place] [facility] [place] [facility]. Last appointment some time ago, next appointment coming up soon. She cannot remember the name of the psychiatrist at [facility].   ALCOHOL AND DRUGS:  Denies drinking alcohol. Denies street or prescription drug abuse. Reports "If that is what they said, let it be, but I don't do  drugs." She got upset and irritable about why these questions are being asked.  MENTAL STATUS:  According to information obtained from the sitter, the patient had been doing just fine. She is seen lying comfortably in  bed. She knew the day, date, time and place. She was irritable and upset when questions are being asked, and confronted about the usage of drugs. She reports that she is depressed. She denies feeling hopeless or helpless. Denies feeling worthless or useless. Admits that she is not suicidal or homicidal. She yelled and stated, "All I want is help. I do want help for my problems. I am not suicidal. I don't want to hurt myself. I don't want to hurt nobody. I want to get help and have my own psychiatrist." Denies auditory or visual hallucinations. Denies delusional or paranoid thinking. Memory is intact. Insight and judgment guarded.  IMPRESSION:  1.  Polysubstance abuse, including cocaine, chronic, continuous. 2.  Substance-induced mood disorder.   Recommend continue current treatment protocol, and when the patient is getting ready for discharge, contact the social services so that patient will be informed about availability of  various programs in the community for substance abuse, if she is interested. The patient currently states that she is not interested in attending any programs, and that she will go back to her psychiatrist in [place]. Recommend discontinuing one-on-one observation, as patient contracts for safety at this time.    ____________________________ Jannet MantisSurya K. Guss Bundehalla, MD skc:mr D: 03/05/2013 19:09:05 ET T: 03/05/2013 21:24:05 ET JOB#: [id number]  cc: Monika SalkSurya K. Guss Bundehalla, MD, <Dictator> Beau FannySURYA K Lowell Mcgurk MD ELECTRONICALLY SIGNED 03/06/2013 13:38

## 2014-08-19 NOTE — Discharge Summary (Signed)
PATIENT NAME:  [name], [name] MR#:  [id number] DATE OF BIRTH:  [birth year]  DATE OF ADMISSION:  09/02/2013 DATE OF DISCHARGE:  09/05/2013  REASON FOR ADMISSION:  She is admitted with shortness of breath.   PAST MEDICAL HISTORY: 1.  Deep vein thrombosis in the past, status post treatment with Coumadin.  2.  Polysubstance abuse.  3.  Anxiety.  4.  Tobacco abuse.  5.  Chronic low back pain.  6.  Chronic renal insufficiency.  7.  Chronic obstructive pulmonary disease.  8.  Noncompliance with treatment.  9.  Acute hypoxic respiratory failure, now resolved.   DISPOSITION: Home.   MEDICATIONS AT DISCHARGE:  1.  Alprazolam 1 mg every 6 hours as needed for sleep.  2.  Albuterol with Atrovent nebulizers 4 times a day as needed for shortness of breath.  3.  ProAir HFA as needed for shortness of breath.  4.  Spiriva 18 mcg once a day inhaled. Advair. 5.  HFA 45 mcg/21 mcg 2 puffs twice daily.  6.  Prednisone 50 mg once a day, then decrease 10 mg a day until gone.  7.  Percocet 10/325 mg every 6 hours as needed for pain. If needed of refills, she needs to contact her primary care physician.  8.  Azithromycin 500 mg once a day for 7 days.  9.  Promethazine 12.5 mg twice daily as needed for nausea.  10.  The patient is going to start a prescription for Chantix which was prescribed by Dr. Mariana Kaufmanobin.    FOLLOWUP:  Dr. Mariana Kaufmanobin in 1 to 2 days.   HOSPITAL COURSE: This is a 49 year old female with history of chronic obstructive pulmonary disease who was admitted with a history of shortness of breath. She was seen in the ER and reported 4 to 5 days of being short of breath and coughing. No fever, no increased secretions. She was started on prednisone and erythromycin at the primary care office without significant improvement, then she came to the Emergency Room, in the ER, chest x-ray did not show any acute abnormalities. She was started on IV some Solu-Medrol. Apparently, she was hypoxic and afebrile during the  examination. She states that at home she had a temperature of 104 which was never documented here as she was afebrile through the whole hospitalization. The patient was hypoxic the first day but then she was off of oxygen during the whole weekend. Yesterday, she was not feeling very well as far as going home. We a long discussion. She said that she probably will be back in the evening of the next day. We compromised on getting discharged today. Today, the patient feels much better. Prescriptions given for antibiotics, pain medications will need to be refilled by primary care physician. Overall, the patient had a good recovery, not needing oxygen for 3 to 4 days despite the fact she was wheezing. I thing overall the patient was much better and able to be discharged yesterday but she did not felt completely well. I that has to do with a lot of her pain issues and unavailability of getting her medications right away. Her pO2 was 78 on admission on the ABG, pCO2 was 37. White count was 6.96 and 5.8 respectively on the 8th and 9th, platelet count was normal. Other than that, the patient did not have any major abnormalities.   CONDITION ON DISCHARGE:  The patient is discharged in good condition.   TIME SPENT: I spent about 45 minutes discharging this patient today.  ____________________________ Felipa Furnace, MD rsg:cs [name]: 09/06/2013 14:03:00 ET T: 09/06/2013 14:40:04 ET JOB#: [id number]  cc: Felipa Furnace, MD, <Dictator> Tyna Huertas Juanda Chance MD ELECTRONICALLY SIGNED 09/16/2013 22:14

## 2014-08-19 NOTE — H&P (Signed)
PATIENT NAME:  [name], [name] MR#:  [id number] DATE OF BIRTH:  Nov 01, 1965  DATE OF ADMISSION:  09/02/2013  PRIMARY CARE PHYSICIAN: [facility] Mar[facility]in [facility]  CHIEF COMPLAINT:  Shortness of breath.   HISTORY OF PRESENT ILLNESS: The patient is a 49 year old female with a past medical history of COPD, still smoking, history of deep vein thrombosis, not on any anticoagulation currently, chronic renal insufficiency, anxiety, history of polysubstance abuse and noncompliance is presenting to the ER with a chief complaint of shortness of breath and cough. The patient reports that over the past 4-5 days she has been short of breath and coughing.  On last Monday,  she was seen by her primary care physician who has gave her prednisone and erythromycin prescription. The patient tried prednisone and erythromycin with no significant improvement. As her shortness of breath is still getting worse, she came into the ER for further evaluation. Chest x-ray revealed no acute abnormalities. The patient was given IV Solu-Medrol and nebulizer treatment and the hospitalist team is called to admit the patient. During my examination, the patient is resting comfortably and reports shortness of breath is significantly improved.  Still wheezing and reports that for the past 2 days she spiked a temperature of 104 degrees Fahrenheit.  Denies any chest pain, abdominal pain, nausea, vomiting, diarrhea. No sick contacts. Admits that she still continues to smoke.   PAST MEDICAL HISTORY:  History of deep vein thrombosis status post Coumadin treatment, which was discontinued by her primary care physician .   Polysubstance abuse, including cocaine. Anxiety, tobacco abuse, chronic low back pain, chronic renal insufficiency, COPD, noncompliance.   PAST SURGICAL HISTORY: Back surgery and neck surgery.   ALLERGIES:  AMOXICILLIN AND SULFA.   PSYCHOSOCIAL HISTORY: Lives at home with husband. Still continues to smoke and reports   smoking 1-2 cigarettes per day. Denies any alcohol or illicit drug use at this time.   FAMILY HISTORY: Hypertension runs in her family.   REVIEW OF SYSTEMS:  CONSTITUTIONAL: Denies any fatigue, but complaining of fever for the past 2 days. No weight loss.  EYES: Denies blurry vision, double vision. No redness or glaucoma.  EARS, NOSE, AND THROAT:  Denies epistaxis, discharge. Denies any tinnitus. RESPIRATION: Complaining of cough, chronic COPD, shortness of breath.  CARDIOVASCULAR: No chest pain, palpitations, syncope.  GASTROINTESTINAL: Denies nausea, vomiting, diarrhea, hematemesis, melena.  NEUROLOGIC: Denies vertigo, ataxia, TIA, CVA.  ENDOCRINE: Denies any polyuria, nocturia, thyroid problems. Denies any diabetes mellitus.  MUSCULOSKELETAL: Denies any osteoarthritis. No joint pains.  Denies any neck pain, back pain. SKIN:. Denies any rashes, lesions.  PSYCHIATRIC: Has chronic history of anxiety. Denies any depression, insomnia, ADD, or OCD.   PHYSICAL EXAMINATION:  VITAL SIGNS: Temperature 98.1, pulse 63, respirations 18-20, blood pressure 115/79, pulse 97% on 2 liters.  GENERAL APPEARANCE: Not in acute distress. Moderately built and nourished.  HEENT: Normocephalic, atraumatic. Pupils are equally reacting to light and accommodation. No scleral icterus. No conjunctival injection. Moist mucous membranes. No sinus tenderness.  NECK: Supple. No JVD. No thyromegaly. Range of motion is intact.  LUNGS: Diffuse wheezing is present. No crackles.  No rhonchi.  No rales. Moderate air entry. No accessory muscle use.  There is no anterior chest wall tenderness on palpation.  CARDIAC: S1, S2 normal. Regular rate and rhythm. No murmurs.  GASTROINTESTINAL: Soft. Bowel sounds are positive in all 4 quadrants. Nontender, nondistended. No hepatosplenomegaly. No masses felt.  NEUROLOGICAL:  Awake, alert, oriented x 3. Cranial nerves II through  XII are intact. Motor and sensory are intact. Reflexes are 2+.   EXTREMITIES: No edema. No cyanosis. No clubbing.  SKIN: Warm to touch. Normal turgor. No rashes. No lesions.  MUSCULOSKELETAL: No joint effusion, tenderness, or edema.  PSYCHIATRIC: Flat mood and affect.   LABORATORIES AND IMAGING STUDIES: Chest x-ray, portable, no active disease. A 12-lead EKG, normal sinus rhythm at 74 beats per minute, normal PR and QRS interval. No acute ST-T wave changes. Troponin less than 0.02. CBC normal. ABG; PH 7.43, pCO2 of 37, pO2 of 78. 21% FiO2. Bicarbonate is 24.6. Chem-8: Glucose is at 103, creatinine 0.56, anion gap is 4.  Rest of the Chem-8 is normal.   ASSESSMENT AND PLAN: A 49 year old female presenting to the ER with a chief complaint of 5-day history of shortness of breath and 2-day history of fever. Was seen by a primary care physician 3 days ago and failed on outpatient p.o. antibiotics and prednisone. Will be admitted with the following assessment and plan:  1. Acute hypoxic respiratory distress from acute exacerbation of chronic obstructive pulmonary disease and acute bronchitis. We will admit the patient to off-unit telemetry. We will provide her IV Solu-Medrol q. 6 hours. 2. We will provide DuoNeb treatments q.6 hours and albuterol q.4 hours p.r.n. The patient will be on IV levofloxacin.  3. Chronic renal insufficiency. Renal function is stable at this time. We will monitor closely and avoid nephrotoxins.  4. History of drug abuse. The patient denies any recent drug illicit drug usage.  5. History of chronic low back pain. We will provide her pain management as needed basis.  6. History of anxiety. Resume her home medication.  7. History of deep vein thrombosis, completed a course of Coumadin. Currently not on any blood thinners.  8. History of smoking. Consultation to quit smoking. The patient is working on that.  She is reporting that primary care physician is starting her on Chantix next week. We will provide her nicotine patch at this time.  Diagnosis  and will provide gastrointestinal and deep vein thrombosis prophylaxis. Plan of care was discussed in detail with the patient. She verbalized understanding of the plan. She is full code. Husband is the medical power of attorney.   TOTAL TIME SPENT ON ADMISSION: 50 minutes.     ____________________________ Ramonita Lab, MD ag:dd [name]: 09/02/2013 03:04:32 ET T: 09/02/2013 04:10:28 ET JOB#: [id number]  cc: Ramonita Lab, MD, <Dictator> Ramonita Lab MD ELECTRONICALLY SIGNED 09/04/2013 4:44

## 2014-08-19 NOTE — Consult Note (Signed)
PATIENT NAME:  [name], ENDO MR#:  [id number] DATE OF BIRTH:  [birth year]  DATE OF CONSULTATION:  05/14/2013  REFERRING PHYSICIAN:   CONSULTING PHYSICIAN:  Keithan Dileonardo K. Guss Bunde, MD  PLACE OF DICTATION:  [facility], room [address]309, [place], [place].   AGE:  49 years.  SEX:  Female.  RACE:  White.  SUBJECTIVE:  The patient was seen in her room.  Staff reports that the patient had been agitated and having visual hallucinations.  She said that she saw a cat and had to explain that this was a hospital.  She is on Ativan 1 mg which is not helping with the agitation.  She has been taking Xanax at home 1 mg at a rate of 1 mg by mouth 4 times a day which she is getting it here in the hospital.  The patient is married for 3 years for a second time and is separated from her husband and moved in to live with her mother who is 66 years as she does not get along with her husband.  The patient reports that she is not employed and is on disability for bipolar disorder.    CHIEF COMPLAINT:  "I am abnormal.  I need help."  HISTORY OF PRESENT ILLNESS:  According to information obtained, the patient has been abusing cocaine and crack and reports that she has been smoking the same.  Smoked on the day of admission and when she was asked how much she did she got upset, irritable and angry.   PAST PSYCHIATRIC HISTORY:  According to information obtained from the staff and the patient, the patient has inpatient psychiatry for detox about 6 times in the past year, that is 2014.  Admits she stayed sober "not much" and immediately she starts using cocaine crack.  Denies any other drug abuse.  Denies using THC.  Denies using heroin.  Denies drinking alcohol.  She gets this cocaine and crack from her friends and people around her.  In fact, she reports that her husband also uses the same.  Being [facility] at [facility] and she does not remember the name of the doctor.  She got upset and irritable when questions  were asked.  Being followed by Dr. Lacie Scotts for primary care.  Last appointment with Dr. Lacie Scotts was November 2014, next appointment coming up February 2015.  The patient reports that she gets her Xanax 1 mg 4 times a day and Percocet from Dr. Lacie Scotts.  MENTAL STATUS EXAMINATION:  The patient is drowsy, but arousable.  She is oriented to place and person.  Upset, irritable and angry.  Got agitated when questions were asked.  Denies auditory or visual hallucinations.  Admits that she is not depressed, not now, and she decided to give up these drugs and wants to stay sober and was asking if she can be released and she can go home.  According to information obtained from the staff, the patient has been hallucinating and having visual hallucinations which is typical of drug withdrawal and she has been seeing black cat running around.  Denies any active suicidal or homicidal plan.  Insight and judgment guarded  IMPRESSION:  Substance-induced psychosis - cocaine, crack and a history of bipolar disorder.  Recommend Haldol 1 mg by mouth or IM q. 4 hours as needed for agitation, Ativan 1 mg by mouth or IM q. 4 hours as needed for agitation or this can be added in the IV fluids along with Haldol if she is very  agitated.  Continue current treatment.  When she is stable medically, she can be discharged back to go back to [place] and to her doctors.  She decided to give up crack cocaine abuse and in which case she needs to be referred to appropriate substance abuse programs for the same.       ____________________________ Jannet MantisSurya K. Guss Bundehalla, MD skc:ea D: 05/14/2013 15:37:48 ET T: 05/14/2013 17:36:50 ET JOB#: [id number]  cc: Monika SalkSurya K. Guss Bundehalla, MD, <Dictator> Beau FannySURYA K Avraham Benish MD ELECTRONICALLY SIGNED 05/15/2013 19:41

## 2014-08-19 NOTE — Consult Note (Signed)
Brief Consult Note: Diagnosis: Polysubstance dependence, Multifactorial delirium resolved. H/O diagnosis of bipolar disorder.   Patient was seen by consultant.   Consult note dictated.   Recommend further assessment or treatment.   Comments: [name] has a h/o substance abuse. She was admitted for exacerbation of COPD. She became delirious. She was seen by dr, Guss Bundehalla who recommended prn Haldol and Ativan. The patient has improved.  I spoke extensively with the mother who is very concern about substance abuse resulting in deterioration of general health. In spite of support and encouragment from the mother, the patient continues to decline substance abuse treatment. She has limited insight into her problems. At this point, she wants to return to her significant other. The mother would like to take her home. There is an old diagnosis of bipolar but the patient is not interested in treatment. the mother feels that Seroquel was helpful.  MSE: Alert, oriented, pleasant. No safety issues, no psychosis, no cravings. She appears tired and has not been able to get out of bed as of yet.  PLAN: 1. The patient does not meet criteria for IVC. Please discharge as appropriate.  2. She declines substance abuse treatment inpatient or outpatient. We will refer the patient for substance abuse treatment if she wishes to.   3. The patient not interested in starting a mood stabilizer.   4. i will follow up and continue to encourage treatment participation.  Electronic Signatures: Kristine LineaPucilowska, Vear Staton (MD)  (Signed 20-Jan-15 19:48)  Authored: Brief Consult Note   Last Updated: 20-Jan-15 19:48 by Kristine LineaPucilowska, Opal Mckellips (MD)

## 2014-08-19 NOTE — Consult Note (Signed)
Brief Consult Note: Diagnosis: Polysubstance dependence, Multifactorial delirium now resolving.   Patient was seen by consultant.   Consult note dictated.   Recommend further assessment or treatment.   Comments: [name] has a h/o substance abuse. She was admitted for exacerbation of COPD. She became delirious. She was seen by dr, Guss Bundehalla who recommended prn Haldol and Ativan. The patient has improved.  I saw her twice today. around noon she was still confused. Later in the evening she is alert and oriented. She declines substance abuse treatment. She is not suicidal or homicidal. She is interested in outpatient treatment.   PLAN: 1. The patient does not meet criteria for IVC. Please discharge asa ppropriate.  2. We will refer to [facility] for substance abuse treatment. They were closed today.  3. I will obtain collateral data from the mother. She is at work now.  4. i will follow up tomorrow.  Electronic Signatures: Kristine LineaPucilowska, Ashaunti Treptow (MD)  (Signed 19-Jan-15 18:37)  Authored: Brief Consult Note   Last Updated: 19-Jan-15 18:37 by Kristine LineaPucilowska, Tymel Conely (MD)

## 2014-08-19 NOTE — H&P (Signed)
PATIENT NAME:  [name], [name] MR#:  [id number] DATE OF BIRTH:  02/23/1966  DATE OF ADMISSION:  05/06/2013  CHIEF COMPLAINT: Shortness of breath.   HISTORY OF PRESENT ILLNESS: The patient is a 49 year old Caucasian female with history of polysubstance abuse, COPD, tobacco abuse, who presents to the Emergency Room complaining of worsening shortness of breath for the past few days. The patient was seen in the Emergency Room, was extremely short of breath, unable to even speak a word out and had to be intubated  in ED.   Presently the patient is intubated, sedated on propofol, has had blood pressure up into the 200s, tachycardic in the 120s. She did receive 3 rounds of nebulizers prior to being intubated. Her father is at bedside at this time.   PAST MEDICAL HISTORY:  1.  Polysubstance abuse including cocaine.  2.  Anxiety.  3.  Tobacco abuse.  4.  Chronic back pain.  5.  CKD. 6.  COPD.  7.  Noncompliance.   PAST SURGICAL HISTORY: None.   ALLERGIES: AMOXICILLIN AND SULFA.   SOCIAL HISTORY: The patient lives at home with her husband. Has been in prison recently. Smokes 1/2 pack a day. No history of alcohol use. Positive for polysubstance abuse including cocaine.   FAMILY HISTORY: Hypertension. Heart disease runs in the family.   REVIEW OF SYSTEMS: Unobtainable secondary to the patient being sedated and intubated. History has been obtained from father at bedside, old records, and the ER staff.   HOME MEDICATIONS:  Include:  1.  Albuterol nebulizer 4 times a day as needed.  2.  Alprazolam 1 mg oral every 6 hours as needed for sleep.  3.  Oxycodone 5 mg oral every 4 hours as needed for pain.  4.  Prednisone taper which is started on 05/04/2013.  5.  ProAir HFA 2 puffs inhaled four times a day as needed.   PHYSICAL EXAMINATION:  VITAL SIGNS: Pulse of 124. Blood pressure 201/113, saturating 100% on the ventilator.  GENERAL: Moderately built Caucasian female patient lying in bed, sedated,  looks critically ill.  HEENT: Atraumatic, normocephalic. Oral mucosa dry and pink. ET tube in place.  RESPIRATORY: Decreased air entry on both sides with biatrial wheezes.  HEART: S1 and S2, tachycardic without any murmurs. No edema. Peripheral pulses feeble.  GASTROINTESTINAL: Soft abdomen. Bowel sounds present.  GENITOURINARY: Foley catheter in place and clear urine draining.  MUSCULOSKELETAL: No joint swelling, redness, or effusion noticed.  NEUROLOGICAL: Babinski's downgoing. Withdraws to pain. Pupils bilaterally equal and reactive to light.   LABORATORY STUDIES: Glucose of 117, BUN 6, creatinine 0.62, sodium 136, potassium 4.5,  alk phos, bilirubin normal. WBC 14.7, hemoglobin 14.9, platelets of 306.   ABG shows pH of 7.17 with pCO2 of 60, pO2 of 267 on the ventilator.   Chest x-ray, portable, shows no acute cardiopulmonary disease. Does show COPD, hyperinflation.   EKG shows sinus tachycardia.   ASSESSMENT AND PLAN:  1.  Acute respiratory failure, needing full vent support secondary to chronic obstructive pulmonary disease exacerbation. Chest x-ray shows no infiltrate. The patient will be started on IV steroids, Combivent. Will decrease the oxygen as tolerated. Her exacerbation is secondary to the patient continuing to smoke at home in spite of advising against it multiple times during hospitalization. The patient will be admitted to critical care unit. Is critically ill with high risk for cardiac arrest and death. Will consult pulmonology for further input with the case.  2.  Accelerated hypertension. The patient's blood  pressure is in 220s. She does not have a history of hypertension but has been on prednisone recently. Also has history of cocaine abuse. I suspect this is what is causing her high blood pressure. Until we have the urine drug screen, will not use any beta blockers. Will put her on hydralazine IV p.r.n. at this time. Also with sedation, the blood pressure should improve.  3.   Deep vein thrombosis prophylaxis with Lovenox.   CODE STATUS: FULL CODE.   CRITICAL CARE TIME: 45 minutes.    ____________________________ Leia Alf Ladiamond Gallina, MD srs:np D: 05/06/2013 17:15:12 ET T: 05/06/2013 17:54:43 ET JOB#: [id number]  cc: Alveta Heimlich R. Ruthia Person, MD, <Dictator> Neita Carp MD ELECTRONICALLY SIGNED 05/17/2013 18:17

## 2014-08-19 NOTE — Discharge Summary (Signed)
PATIENT NAME:  [name], [name] MR#:  [id number] DATE OF BIRTH:  November 09, 1965  DATE OF ADMISSION:  05/06/2013 DATE OF DISCHARGE:  05/18/2013   REASON FOR ADMISSION: Shortness of breath, polysubstance abuse.   DISCHARGE DIAGNOSES: 1.  Chronic obstructive pulmonary disease exacerbation. 2.  Polysubstance abuse. 3.  Acute respiratory failure, requiring ventilatory support.  4.  Hyperkalemia.  5.  Hypokalemia now.  6.  Hypertension due to withdrawal stress, short time on Norvasc, but now discharged without medications.  7.  Extubated 05/13/2013.   DISPOSITION: Home. The patient refused to go to inpatient rehab. Psychiatry says that she is not committed at this moment, so she could be discharged home.   HOME MEDICATIONS: Apresoline 1 mg every 6 hours as needed for anxiety, albuterol 2.5 mg as needed for wheezing, ProAir HFA 90 mcg every 4 hours as needed for cough, prednisone 20 mg tapered down, azithromycin 500 mg for 7 days.  Walker to help ambulate. Follow up with primary care physician.   HOSPITAL COURSE: This is a 49 year old female with history of smoking, COPD, drug abuse,  anxiety disorders, tobacco abuse, CKD, noncompliance, who came with a history of shortness of breath. In the Emergency Department, the patient started getting some worsening and could not tolerate BiPAP, for which she was intubated. The patient was sedated, put on propofol, and remained on the ventilator from 05/06/2013 to 05/13/2013, 7  days. The patient had significant problems getting extubated, failed weaning due to withdrawal symptoms. Pulmonary was on the case helping. The patient was treated with steroids. There was no need to initiate antibiotics, as the patient did not have any infiltrates. Psychiatry evaluated the patient, said that she was not suicidal, was not an intentional overdose, with a COPD exacerbation, for which the patient is going to be able to be discharged. There were a lot of family concerns about the  patient going back home, and her mom said at the end that she was not going to take her back. The patient refused to go to inpatient rehabilitation, and at this moment we cannot force her to do that. The patient overall is saying that she is going to commit to outpatient treatment but not inpatient treatment, but her mom does not believe this because it has happened in the past. She has had to be intubated 7 times day, and she always says the same without quitting drugs. The patient is anxious to be discharged. She has critical care related weakness and myositis, for which she is going to need a walker to ambulate.   Her blood pressure was elevated, treated with Norvasc, but at this moment it has been more related to her anxiety, pain and withdrawal issues, for which we are going to take her off the Norvasc and send her home without blood pressure management. The patient says that she will go find a primary care physician and get this looked at.   I spent about 60 minutes with this patient and family today.   ____________________________ Felipa Furnaceoberto Sanchez Gutierrez, MD rsg:jcm [name]: 05/18/2013 13:36:38 ET T: 05/18/2013 13:57:12 ET JOB#: [id number]  cc: Felipa Furnaceoberto Sanchez Gutierrez, MD, <Dictator> Jackey Housey Juanda ChanceSANCHEZ GUTIERRE MD ELECTRONICALLY SIGNED 05/22/2013 8:27

## 2014-08-20 NOTE — Consult Note (Signed)
PATIENT NAME:  [name], SHENEFIELD MR#:  [id number] DATE OF BIRTH:  05/27/1965  DATE OF CONSULTATION:  06/13/2011  REFERRING PHYSICIAN:   CONSULTING PHYSICIAN:  Audery Amel, MD  PSYCHIATRY CONSULTATION FOLLOWUP PROGRESS NOTE: The patient was reevaluated this morning.  The patient has been in the hospital because of acute complaints of shortness of breath. She has a history of chronic obstructive pulmonary disease and a recent diagnosis of a pneumonia. At this point, it is my understanding that the Internal Medicine service does not feel that she needs inpatient level medical treatment any further. There is continued concern about her psychiatric treatment. On interview today, the patient has a chief complaint of "I just want to stay on my Seroquel and Xanax and I will be fine". She tells me that she is anxious and upset today because she is afraid she will leave the hospital and not be able to continue taking Xanax. She has been taking Xanax chronically for years. Most recently she had been getting her prescriptions from Dr. Lacie Scotts in the community. However, at this point, she is out and she does not have any money to see him. She tells me that it would cost her 100 dollars to make an appointment to see him and that she does not have that kind of money right now. She thinks that she will get her tax refund within a couple of weeks and then will be able to make a follow-up appointment. She is afraid that she is going to have worsening panic attacks and generalized anxiety if she goes home and does not have Xanax. Her affect is anxious and tearful, but re-directable. She easily calms down with a little bit of reassurance and soothing. She is not showing signs of psychotic thinking. She is not delirious. She is well oriented and expresses reasonable judgment and insight within the context of her situation. That is to say the patient has been prescribed alprazolam 1 mg two to three times a day chronically for  years, of which we have documentation, and considers it her primary treatment for her genuine anxiety. She is probably correct that if she were to abruptly discontinue it she would get withdrawal symptoms which would make her baseline anxiety that much worse. Additionally, she has been on intravenous doses of steroids. It is documented that steroids tend to greatly worsen her anxiety and agitation. I have reviewed the notes and spoken with the patient. There is no evidence that I see in the chart of her having overdosed on or abused her benzodiazepine. The patient denies that she has ever overdosed on benzodiazepines. She denies that she has ever tried to kill herself in the past. She totally denies any current suicidal ideation saying that she is afraid to die and just wants to feel right. Although 1 mg three times a day is a moderately high dose of alprazolam, she seems to be acclimated to it and again we do not have any reason to think that she is abusing it.   I understand that ideally the treatment team would prefer to not be prescribing her chronic benzodiazepines at discharge. However, in this situation, I think the risks of discharging her without the benzodiazepines appear to be higher than the risk of discharging her with the benzodiazepines. Her story is convincing and I think it is probably correct that she does not have any way to see an outpatient doctor in the immediate future who will prescribe her Xanax for her.  I recommend that if she is to be discharged from the medical service that she be given a one-month prescription for Xanax 1 mg three times a day. If the team does not feel comfortable with this, at least a two week prescription might get her some time to manage to find an outpatient doctor. Additionally, I would recommend that she be given a prescription for the Seroquel at the current dose of 25 mg three times daily and 100 mg at night. She is aware that Seroquel is significantly more  expensive, even as a generic, than the alprazolam, but thinks that she can get some family to help her to afford it, if only she can get the prescription. The quetiapine is not typically thought of as being an abusable or addictive medicine and she clearly tolerates it and gets some benefit from it. I think the risk of prescribing this for her is very low and the potential benefit to helping her to stay stable and not feel that she needs to come right back to the emergency room would be significant.   DIAGNOSIS PRINCIPLE AND PRIMARY:   AXIS I:  1. Generalized anxiety disorder.  2. Anxiety disorder secondary to recent use of steroids and general medical condition (chronic obstructive pulmonary disease).   AXIS II: Deferred.   AXIS III: Chronic obstructive pulmonary disease, recent upper respiratory infection.   AXIS IV: Severe stress from financial limitations, lack of insurance, lack of access to care.   AXIS V: Functioning currently is 45.         At this point, she is not stating any suicidal or homicidal ideation. She does not appear to be psychotic or delirious. I do not think she requires inpatient psychiatric treatment.  ____________________________ Audery AmelJohn T. Dahiana Kulak, MD jtc:slb D: 06/13/2011 11:09:28 ET T: 06/13/2011 11:24:13 ET JOB#: [id number]  cc: Audery AmelJohn T. Darnisha Vernet, MD, <Dictator> Audery AmelJOHN T Jeanette Rauth MD ELECTRONICALLY SIGNED 06/13/2011 14:57

## 2014-08-20 NOTE — H&P (Signed)
PATIENT NAME:  [name], [name] MR#:  [id number] DATE OF BIRTH:  02/14/1966  DATE OF ADMISSION:  07/29/2011  REFERRING PHYSICIAN: Dr. Governor Rooks   PRIMARY CARE PHYSICIAN: Dr. Lacie Scotts    CHIEF COMPLAINT: Shortness of breath.   HISTORY OF PRESENT ILLNESS: The patient is a 49 year old Caucasian female with history of polysubstance abuse in the past, tobacco abuse, chronic obstructive pulmonary disease with several flares requiring intubations in the past, hypertension as well as possible adrenal insufficiency, not taking hydrocortisone, who presents with shortness of breath, nonproductive cough for four days. The patient has had increased wheezing. No fevers, although feels cold sometimes. She has no rigors. There is no lower extremity edema. She still smokes half a pack a day. There are no sick contacts. She has no chest pain but has chest soreness from her extensive breathing. On arrival, she was tachypneic, rate of 28, and had increased work of breathing and was able to talk in short 3 to 4 word sentences. She improved with nebulizers, steroids, and she was given antibiotics. Hospitalist services were contacted for further evaluation and management.   PAST MEDICAL HISTORY:  1. Anxiety disorder. 2. Possible adrenal insufficiency. 3. Hypertension. 4. Chronic obstructive pulmonary disease with multiple flares in the past. 5. History of polysubstance abuse.  MEDICATIONS: 1. Xanax 1 mg t.i.d.  2. Hydrocortisone 10 mg in the morning and 5 mg at night, which she has not taken since the previous discharge. 3. Seroquel 200 mg at bedtime, 75 mg t.i.d.  4. Symbicort 160/4.5 mcg 2 puffs twice a day. 5. Aspirin 81 mg daily.  6. She has finished her albuterol respiratory inhaler and has not been on this for greater than 1-1/2 months.  ALLERGIES: Sulfa and amoxicillin.   SOCIAL HISTORY: Smokes half a pack a day for the last 30 years. Occasional alcohol. No drug use.   FAMILY HISTORY: Positive for  hypertension. Mom and dad died with MI.   REVIEW OF SYSTEMS: CONSTITUTIONAL: No fevers. There is overall weakness. No weight changes. There is some dizziness. ENT: No tinnitus, hearing loss, snoring, sore throat. RESPIRATORY: Cough as above. Wheezing as above. No hemoptysis. There is dyspnea on exertion and history of COPD. CARDIOVASCULAR: No chest pain, orthopnea, edema, or arrhythmia. There is dyspnea on exertion. No palpitations or syncope. GI: No nausea currently but she had some nausea earlier. No vomiting or diarrhea. No abdominal pain. No rectal bleeding. No constipation. GU: Has incontinence with her coughing outbursts. No hematuria or dysuria. ENDOCRINE: No polyuria, nocturia, or thyroid problems. HEME/LYMPH: No anemia or easy bruising. SKIN: No new rashes. MUSCULOSKELETAL: She has chronic arthritis. NEUROLOGIC: No numbness or weakness. PSYCH: Has history of anxiety.   PHYSICAL EXAMINATION:   VITAL SIGNS: On arrival, temperature 96.9, pulse rate 109, respiratory rate 28, oxygen sat 92% on room air. Currently respiratory rate 21, oxygen sat 97% on oxygen.   GENERAL: The patient is a thin Caucasian female sitting in bed, no obvious distress currently, talking in full sentences.   HEENT: Normocephalic and atraumatic. Pupils equally reactive. Moist mucous membranes. Extraocular muscles intact.    NECK: Supple. No JVD. No thyroid tenderness.   CARDIOVASCULAR: S1, S2. No rubs or murmurs.   LUNGS: Bilateral diffuse wheezing. No crackles.   ABDOMEN: Soft, nontender, nondistended. Positive bowel sounds in all quadrants. No rebound or guarding.   EXTREMITIES: No significant lower extremity edema.   NEUROLOGICAL: Cranial nerves II through XII grossly intact. Strength 5 out of 5 in all extremities. Sensation intact  to light touch.   PSYCH: Awake, alert, oriented x3, somewhat anxious.  LABORATORY, DIAGNOSTIC, AND RADIOLOGICAL DATA: Glucose 92, BUN 4, creatinine 0.74, sodium 142, potassium 3.7,  CO2 20. LFTs within normal limits. Troponin negative x1. WBC 11.9, hemoglobin 14.4, hematocrit 42.5, platelets 210.   EKG sinus tachycardia, rate 101, possible left atrial enlargement. No acute ST elevations or depressions.   X-ray of chest no official read but I see some haziness in the right middle lobe obscuring the border of the heart.   ASSESSMENT AND PLAN: We have a 49 year old Caucasian female with history of chronic obstructive pulmonary disease requiring intubations in the past, tobacco abuse, substance abuse in the past including cocaine, and anxiety who presents with acute on chronic respiratory failure, shortness of breath, and wheezing likely in the setting of COPD exacerbation and possible pneumonia. The patient did have a hospitalization in February, however, was less than 48 hours in the hospital. There is no official read of the x-ray and if there is pneumonia this likely is community-acquired pneumonia. Will start Levaquin and treat the COPD flare with steroids, nebulizers, and supplemental oxygen. She has felt better with nebulizer treatments. I discussed tobacco cessation strongly with her, greater than three minutes. She said she will not smoke again. I would continue anxiety medications as well as the Seroquel. She has not taken her hydrocortisone as she was supposed to for her adrenal insufficiency. I will continue steroids. She will be on Solu-Medrol for now and she should follow-up with her endocrinologist as an outpatient for further follow-up with her adrenal insufficiency. Currently she is hemodynamically stable.   CODE STATUS: The patient is a FULL CODE.     TOTAL TIME SPENT: 55 minutes.   ____________________________ Krystal EatonShayiq Tnia Anglada, MD sa:drc D: 07/29/2011 21:34:00 ET T: 07/30/2011 07:10:27 ET JOB#: [id number]  cc: Krystal EatonShayiq Janit Cutter, MD, <Dictator> Meindert A. Lacie ScottsNiemeyer, MD  Krystal EatonSHAYIQ Lashanti Chambless MD ELECTRONICALLY SIGNED 08/03/2011 15:07

## 2014-08-20 NOTE — Consult Note (Signed)
PATIENT NAME:  [name], [name] MR#:  [id number] DATE OF BIRTH:  1966-04-11  DATE OF CONSULTATION:  06/12/2011  REFERRING PHYSICIAN:   CONSULTING PHYSICIAN:  Audery Amel, MD  IDENTIFYING INFORMATION AND REASON FOR CONSULT: This is a 49 year old woman who presented to the Emergency Room with shortness of breath. Consult was for assistance with anxiety.   HISTORY OF PRESENT ILLNESS: Information was obtained from the patient and from the old chart. The patient presented to the Emergency Room today with shortness of breath and feeling weak. She has chronic anxiety symptoms. She appears to be currently suffering from an exacerbation of her chronic obstructive lung disease with a pneumonia on top of it. She was described as having a great deal of wheezing looking like she was having a lot of respiratory distress when she came in. The patient has been admitted to the hospital and is being treated for her condition with antibiotics and also with IV steroids. Because she has a history of anxiety attacks and generalized anxiety as well as worsening anxiety under some medical conditions, assistance was requested for managing it. The patient tells me that right now she is feeling agitated and anxious. She says that's normal for her much of the time but especially when she gets shortness of breath and gets sick. In addition, she has a clear past history of getting even more agitated and anxious when she has to get steroids for her lung disease. She says that recently she has not been sleeping very well at home. She has been getting increasingly sick. She has chronic anxiety. She sees some connection between her anxiety and shortness of breath but also knows that she has a primary lung problem. She lives with her brother-in-law and sister. She thinks this is a satisfactory situation and denies any acute stress at home. Denies feeling depressed. Denies feeling any suicidal ideation. She took Seroquel last time she was in  the hospital under similar circumstances and was prescribed it at discharge but has not kept up with follow-up recommended psychiatric treatment and has been off that medicine at least since last September. She still has been taking Xanax 1 mg 3 times a day which has been chronic for years. She denies that she has been drinking or abusing drugs.   PAST PSYCHIATRIC HISTORY: Long history of panic attacks and generalized anxiety. Does not seem to have had a more complete psychiatric evaluation other than during the crisis when she was here short of breath in the past. She has been treated by her outpatient primary care doctor with Xanax 1 mg 3 times a day and has been on that chronically. Most of the time it seems to be effective for her. It is not known whether she has ever been on any other medicines for anxiety. When she was here in the hospital previously and was agitated after being extubated, she was put on Seroquel to help with delirium and agitation. The patient says that she found that to be very helpful to calm her down and helped her anxiety to feel under better control. She denies any history of suicide attempts. Not known if she has a history of any substance problems.   PAST MEDICAL HISTORY:  1. Chronic obstructive pulmonary disease.  2. Possible adrenal insufficiency.  3. History of pretty extreme reactions to needing to take steroids with worsening insomnia and anxiety.   REVIEW OF SYSTEMS: Currently complains of being short of breath. Feels in pain especially where her IV  site is. Feels achy all over, irritated, anxious, restless. Denies psychotic symptoms.   MENTAL STATUS EXAMINATION: The patient was asleep, or at least partially asleep, when I came in. She was fairly easy to arouse. She seemed to be in some discomfort and made it clear that she did not want to engage in a long conversation but she did tell me that she was feeling anxious. Eye contact was decreased. Psychomotor activity was  fidgety. Speech was quiet but with somewhat irritable tone. Affect was a little bit irritable but not hostile. Thoughts appeared to be generally lucid. No sign of psychotic thinking. Denied hallucinations. Denied suicidal or homicidal ideation. Seems to have reasonable judgment and insight.   ASSESSMENT: This is a 49 year old woman with COPD exacerbation and lung infection who also has a history of panic attacks which get complicated and worsened by her respiratory problems and also by steroids. Currently she is needing hospitalization for her lung problems. She is feeling fidgety and agitated. She would probably be expected to continue this way or even feel worse since she's needing to get IV Solu-Medrol. The patient is cooperative. Since she does take the Xanax regularly and that has been stable for years, there is no reason to try cutting back on that. She is also requesting to be started back on the Seroquel she was taking before which seems reasonable.   TREATMENT PLAN:  1. Approve of the Xanax being 1 mg three times a day standing. Would not be against even going up on that if it were necessary for a while.  2. Restarted the Seroquel 25 mg three times a day and 100 mg at night that she was taking previously. Also provided another 100 mg as p.r.n. for sleep if needed at night.  3. Will follow-up and see how she is doing in the hospital.   DIAGNOSES PRINCIPLE AND PRIMARY:  AXIS I: Anxiety disorder, not otherwise specified.   SECONDARY DIAGNOSES:  AXIS I: Anxiety secondary to medical condition.   AXIS II: Deferred.   AXIS III:  1. Chronic obstructive pulmonary disease. 2. Pneumonia. 3. Adrenal insufficiency.   AXIS IV: Severe. Acute stress from illness.   AXIS V: Functioning at time of evaluation 49.   ____________________________ Audery AmelJohn T. Londin Antone, MD jtc:drc D: 06/12/2011 16:38:56 ET T: 06/12/2011 17:17:05 ET JOB#: [id number]  cc: Audery AmelJohn T. Terah Robey, MD, <Dictator> Audery AmelJOHN T Shannin Naab  MD ELECTRONICALLY SIGNED 06/12/2011 17:45

## 2014-08-20 NOTE — Discharge Summary (Signed)
PATIENT NAME:  [name], [name] MR#:  [id number] DATE OF BIRTH:  14-Apr-1966  DATE OF ADMISSION:  06/12/2011 DATE OF DISCHARGE:  06/13/2011  DISCHARGE DIAGNOSES:  1. Acute on chronic respiratory failure likely due to chronic obstructive pulmonary disease exacerbation, now back to baseline. The patient does not have any wheezing or any trouble breathing. Pulmonary physician Dr. Welton Flakes also evaluated the patient and agreed with the same.  2. Anxiety/agitation. The patient was cleared by psychiatrist Dr. Toni Amend. He recommended continuing home medication and outpatient psychiatric followup as early as possible, which was set up.   SECONDARY DIAGNOSES:  1. Anxiety.  2. Possible adrenal insufficiency. 3. Hypertension. 4. Chronic obstructive pulmonary disease.   CONSULTATIONS:  1. Pulmonary, Dr. Freda Munro. 2. Psychiatry, Dr. Toni Amend.   PROCEDURES/RADIOLOGICY:  1. Chest x-ray on 02/14 showed no acute cardiopulmonary disease.  2. Chest x-ray on 02/15 showed possible mild interstitial edema. No focal pneumonia.   MAJOR LABORATORY PANEL: Urinalysis on admission was negative.   HISTORY AND SHORT HOSPITAL COURSE: The patient is a 49 year old female with the above-mentioned medical problems who was admitted for possible acute on chronic respiratory failure likely due to chronic obstructive pulmonary disease exacerbation. She was started on nebulizer breathing treatments, steroids, and antibiotic. While in the hospital she was found to have more anxiety than really chronic obstructive pulmonary disease, which was also indicated on admission History and Physical dictated by Dr. Sherryll Burger. The patient was evaluated by psychiatry. Dr. Toni Amend recommended resuming her home psychiatric medication. Pulmonary consult was obtained with Dr. Freda Munro who agreed with the medical management and also recommended discharge home as the patient seemed stable pulmonary-wise. The patient was very insistent on getting  benzodiazepine and narcotics to take home, which I have refused to fill for a long course. I did agree to fill it short term until she gets to see her psychiatric doctor and her medical doctor.  She was [facility] with [facility]) on Monday, 06/16/2011 at 11:30 a.m. by care management, which early on the  patient refused. She was also very violent and abusive when I refused to fill her narcotics and benzodiazepine and wanted to leave AGAINST MEDICAL ADVICE, but then she changed her mind and decided to stay.  Patient relations were called and subsequently the police were also called to help her and avoid any injury and for patient's safety. While the police were in the room the patient agreed to see psychiatry, which early on she refused to see as an outpatient. (She stated, "Nobody gives me what I want as an outpatient.")  The patient also agreed to sign discharge papers and wanted to go. I have had long discussions with the patient,  care management team, patient relations team, department of medicine chief, Dr. Darrick Huntsman, risk management from the hospital, and Dr. Freda Munro along with Dr. Toni Amend who were all in agreement with her discharge and discharge planning. The patient is being discharged in safe and stable condition home with an agreement that she will follow up with outpatient physicians.  On the date of discharge, her vital signs are as follows: Temperature 98.3, heart rate 96 per minute, respirations 18 per minute, blood pressure 111/71 mmHg. She was saturating 94 to 95% on room air.   PERTINENT PHYSICAL EXAMINATION: CARDIOVASCULAR: S1, S2 normal. No murmurs, rubs, or gallop. LUNGS: Clear to auscultation bilaterally. No wheezing, rales, rhonchi, or crepitation. ABDOMEN: Soft, benign. NEUROLOGIC: Nonfocal examination. PSYCH: She seemed anxious and agitated. All other physical  examination remained at baseline.   DISCHARGE MEDICATIONS:  1. Xanax 1 mg p.o. t.i.d.  2. Quetiapine 25  mg p.o. 3 times a day.  3. Quetiapine 200 mg p.o. at bedtime.  4. Symbicort 160/4.5 inhaler twice a day.  5. Prednisone 60 mg p.o. daily, taper 10 mg p.o. daily until finished.  6. Zithromax 500 mg p.o. daily for four days.  7. Hydrocortisone 10 mg p.o. every morning and 5 mg p.o. every night.   DISCHARGE DIET: Low sodium.   DISCHARGE ACTIVITY: As tolerated.   DISCHARGE INSTRUCTIONS AND FOLLOWUP:  1. The patient was instructed to follow-up with her primary care physician, Dr. Lacie ScottsNiemeyer, as scheduled on 02/15 at 3: 45. 2. She was also sent instructed to [facility] 2 weeks.  3. She was set up to see psychiatry at [facility] on Monday, 02/18 at 11:30 a.m.   TOTAL TIME DISCHARGING THIS PATIENT:  55 minutes.   ____________________________ Ellamae SiaVipul S. Sherryll BurgerShah, MD vss:bjt D: 06/17/2011 10:02:36 ET T: 06/17/2011 11:32:37 ET JOB#: [id number]  cc: Tameca Jerez S. Sherryll BurgerShah, MD, <Dictator> Meindert A. Lacie ScottsNiemeyer, MD Yevonne PaxSaadat A. Khan, MD Audery AmelJohn T. Clapacs, MD  [facility] Ellamae SiaVIPUL S [facility] MD ELECTRONICALLY SIGNED 06/17/2011 12:04

## 2014-08-20 NOTE — H&P (Signed)
PATIENT NAME:  [name], [name] MR#:  [id number] DATE OF BIRTH:  1965/11/27  DATE OF ADMISSION:  06/12/2011  PRIMARY CARE PHYSICIAN: [facility]  PULMONOLOGIST: Dr. Welton FlakesKhan  REQUESTING PHYSICIAN: Dr. Bayard Malesandolph Brown   CHIEF COMPLAINT: Shortness of breath.   HISTORY OF PRESENT ILLNESS: Patient is a 49 year old female with a known history of anxiety, chronic obstructive pulmonary disease and possible adrenal insufficiency is being admitted for acute on chronic respiratory failure likely secondary to chronic obstructive pulmonary disease exacerbation. Patient was recently diagnosed with pneumonia about two weeks ago when she was treated as an outpatient. Was brought into the Emergency Department today for worsening shortness of breath. She has also had a productive cough. While in the ER she continued to be extensively wheezing (per ED PHYSICIAN), was using accessory muscles of respiration, received multiple nebulizer breathing treatment along with Solu-Medrol without much improvement, was subsequently refusing the nebs as per the ED physician. She also refused steroid. She was given 2 mg of IV Ativan and when I evaluated patient she was sleeping comfortably while in the room. She is being admitted for further evaluation and management.   PAST MEDICAL HISTORY:  1. Anxiety. 2. Possible adrenal insufficiency.  3. Hypertension. 4. Chronic obstructive pulmonary disease.   HOME MEDICATIONS:  1. Xanax 1 mg p.o. t.i.[name].  2. Hydrocortisone 10 mg p.o. every morning, 5 mg p.o. every night.  3. Seroquel 200 mg p.o. at bedtime.  4. Flexeril 10 mg p.o. t.i.[name].  5. Symbicort 160/4.5 twice a day   ALLERGIES: Sulfa.   SOCIAL HISTORY: Smoking half pack of cigarettes daily for last 30 years. Occasional alcohol. No IV drugs of abuse.   FAMILY HISTORY: Positive for hypertension in the mother.   REVIEW OF SYSTEMS: Unable to be obtained as patient is very sleepy after 2 mg of IV Ativan. She wakes up for a little  bit on verbal stimulus but goes back to sleep.   PHYSICAL EXAMINATION:  VITAL SIGNS: Temperature afebrile, heart rate 124 per minute, respirations 26 per minute, blood pressure 128/80 mmHg. She is saturating 94% on 2 liters oxygen via nasal cannula. She was placed on 10 liters when she came to the Emergency Department when she was saturating 97% on 10 liters.   GENERAL: Patient is a 49 year old female lying in the bed, very sleepy without any acute distress.   EYES: Pupils equal, round, reactive to light and accommodation. No scleral icterus. Extraocular muscles intact.   HENT: Head atraumatic, normocephalic. Oropharynx and nasopharynx clear.   NECK: Supple. No jugular venous distention. No thyroid enlargement or tenderness.   LUNGS: Decreased breath sounds at the bases. No rales or rhonchi. No Wheezing.  CARDIOVASCULAR: S1, S2 normal, tachycardic. No murmurs, rubs, or gallop.   ABDOMEN: Soft, nontender, nondistended. Bowel sounds present. No organomegaly or mass.   EXTREMITIES: No pedal edema, cyanosis, or clubbing.   NEUROLOGIC: Nonfocal examination. She is very sleepy. Unable to evaluate further neurological exam as she has received 2 mg IV Ativan in the Emergency Department.   PSYCH: Unable to evaluate as she is too sleepy after Ativan.   LABORATORY, DIAGNOSTIC, AND RADIOLOGICAL DATA: Normal BMP except potassium 3.4. Normal liver function tests. Normal first set of cardiac enzymes. Normal CBC except white count 15.4. ABG showed pH 7.36, pCO2 45, pO2 66, bicarbonate 25.4.   Chest x-ray showed no acute cardiopulmonary disease per Emergency Department physician.   IMPRESSION AND PLAN:  1. Possible Acute on chronic respiratory failure likely due to  chronic obstructive pulmonary disease exacerbation. Will provide oxygen, nebulizer breathing treatment with Xopenex, Atrovent, Solu-Medrol IV q.6. Will start on Zithromax for now.  2. Possible Chronic obstructive pulmonary disease  exacerbation. Will provide management as above. Continues Symbicort.   3. Anxiety. Will continue Xanax. Will consult psychiatry for evaluation for any dose adjustment for Xanax and/or Seroquel. I think this is the biggest issue.  4. Possible adrenal insufficiency. Will continue hydrocortisone at this time.  5. CODE STATUS: FULL CODE.   TOTAL TIME TAKING CARE OF THIS PATIENT: 55 minutes.  ____________________________ [name], MD [name]: 06/12/2011 08:05:34 ET T: 06/12/2011 08:51:57 ET JOB#: [id number]  cc: Wildon Cuevas S. Sherryll Burger, MD, <Dictator> [facility] Ellamae Sia [facility] MD ELECTRONICALLY SIGNED 06/13/2011 22:43

## 2014-08-20 NOTE — Discharge Summary (Signed)
PATIENT NAME:  [name], [name] MR#:  [id number] DATE OF BIRTH:  [birth year]  DATE OF ADMISSION:  07/29/2011 DATE OF DISCHARGE:  08/01/2011  For a detailed note, please take a look at the history and physical done by Dr. Krystal EatonShayiq Ahmadzia done on admission.   DISCHARGE DIAGNOSES: 1. Chronic obstructive pulmonary disease exacerbation secondary to ongoing tobacco abuse.  2. Urinary incontinence.  3. History of anxiety/depression.   DIET: The patient is being discharged on a regular diet.   ACTIVITY: As tolerated.   DISCHARGE FOLLOWUP: Follow with Dr. Lacie ScottsNiemeyer in the next 1 to 2 weeks. The patient is also going to followup with Dr. Freda MunroSaadat Khan from pulmonary on 08/05/2011 at 3:15 p.m.   DISCHARGE MEDICATIONS:  1. Xanax 1 mg three times daily. 2. Ditropan XL 5 mg twice a day. 3. Seroquel 75 mg three times daily. 4. Seroquel 200 mg daily. 5. Advair 250/50 one puff twice a day. 6. Spiriva 1 puff daily.  7. Prednisone taper starting at 60 mg down to 10 mg over the next 12 days.  8. Levaquin 500 mg daily x5 days.   PERTINENT STUDIES: Chest x-ray on admission: No acute cardiopulmonary disease.   HOSPITAL COURSE: This is a 49 year old female with medical problems as mentioned above who presented to the hospital secondary to shortness of breath and cough and likely chronic obstructive pulmonary disease exacerbation.  1. Chronic obstructive pulmonary disease exacerbation: This was likely secondary to medical noncompliance and also due to ongoing tobacco abuse. The patient was aggressively treated with IV steroids, around-the-clock nebulizer treatments, and also empiric antibiotics. Over the past 2 to 3 days the patient's bronchospasm and symptoms have significantly improved. She still has some wheezing, but was ambulated on room air and did not desaturate lower than 92%. She does have some mild tachycardia related to her respiratory distress, but she has significantly improved. She is currently being  discharged on Advair, Spiriva, prednisone taper, and empiric Levaquin as stated. She was strongly advised to quit smoking.  2. Anxiety/depression: The patient was maintained on her Xanax and Seroquel, and she will resume that. I gave her a prescription, no longer than a 10 day supply, as she has been noncompliant and not followed up with doctors in the past.  3. Urinary incontinence: The patient was maintained on Ditropan. She will resume that upon discharge too.   CODE STATUS: THE PATIENT IS A FULL CODE.   TIME SPENT: 40 minutes. ____________________________ Rolly PancakeVivek J. Cherlynn KaiserSainani, MD vjs:slb [name]: 08/01/2011 14:24:36 ET T: 08/02/2011 14:39:37 ET JOB#: [id number]  cc: Rolly PancakeVivek J. Cherlynn KaiserSainani, MD, <Dictator> [place] A. Lacie ScottsNiemeyer, MD Yevonne PaxSaadat A. Khan, MD [place] SirenVIVEK J Alondra Vandeven MD ELECTRONICALLY SIGNED 08/04/2011 14:56

## 2014-12-28 DEATH — deceased

## 2015-05-13 IMAGING — CR DG CHEST 1V PORT
1 series · 2 of 2 positions shown · non-contrast
Comparison: Chest x-ray 02/15/2013.

CLINICAL DATA: Status post intubation.

EXAM:
PORTABLE CHEST - 1 VIEW

[Series 2: x chest ap · 0.14mm/px · 2 of 2 slices shown]
[im 1/2]
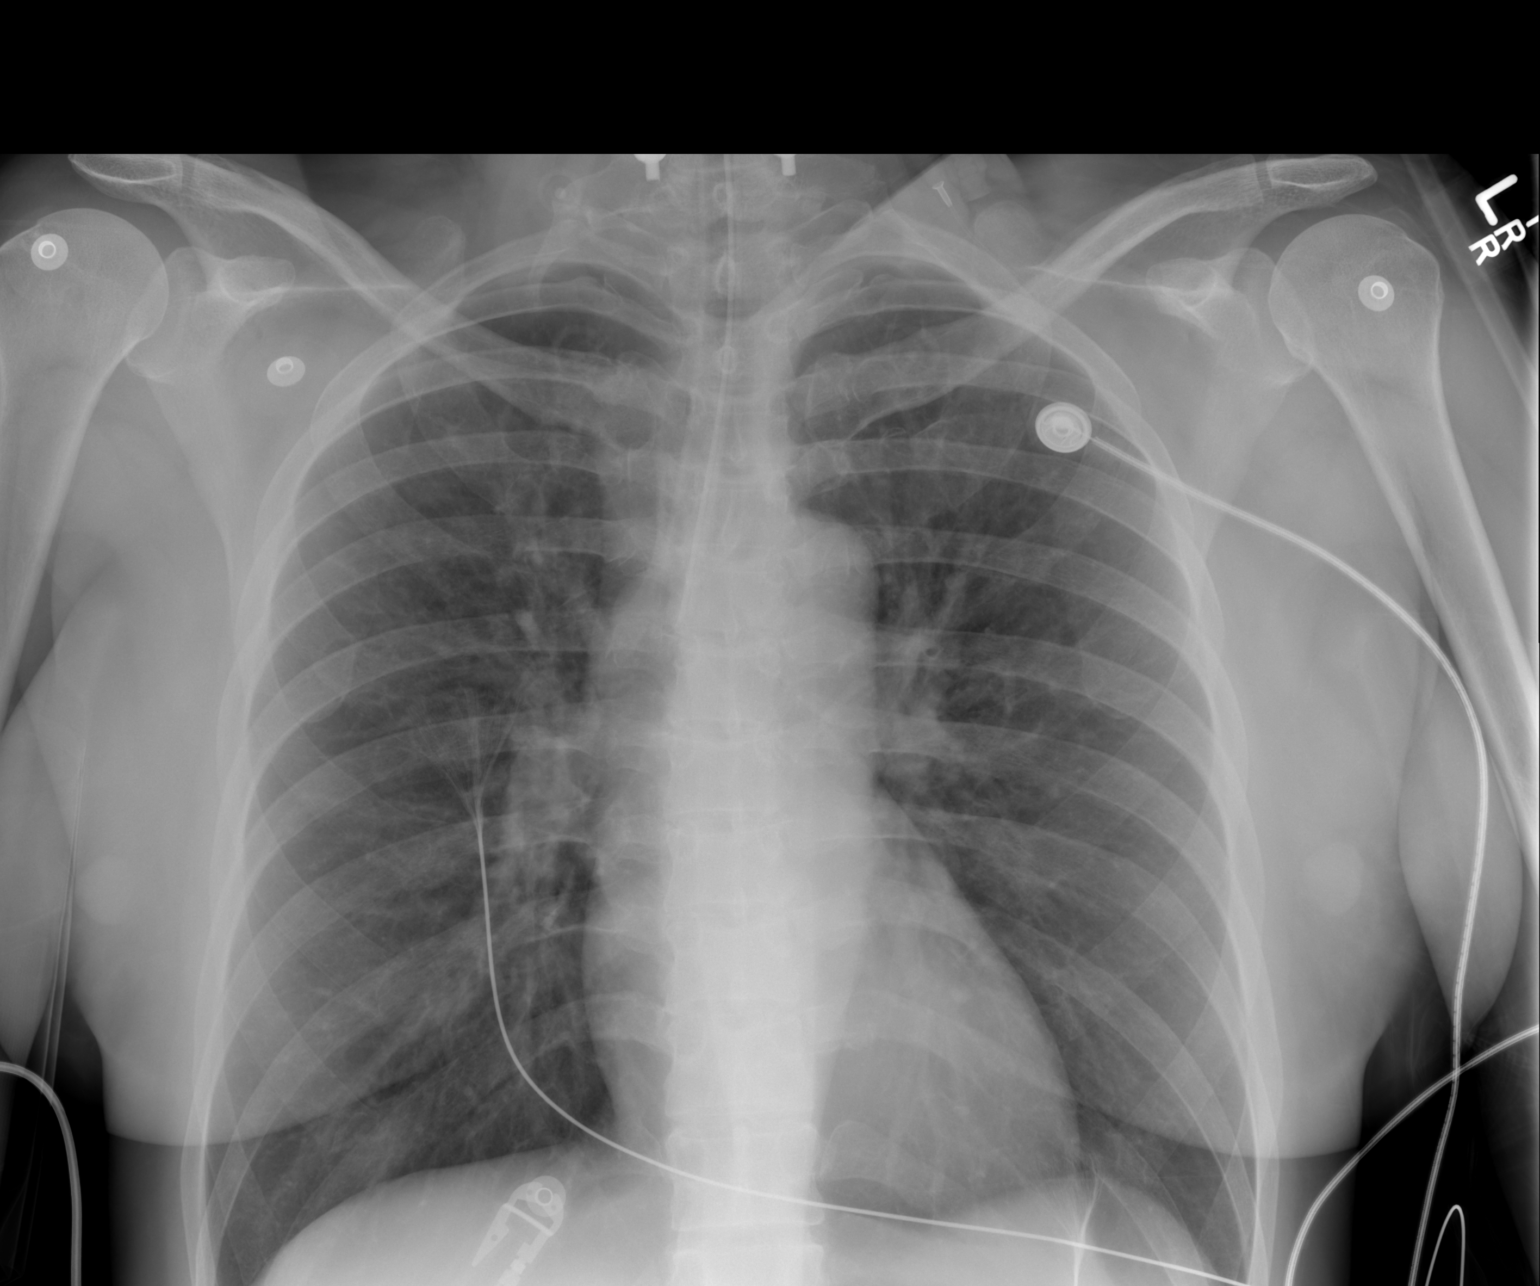
[im 2/2]
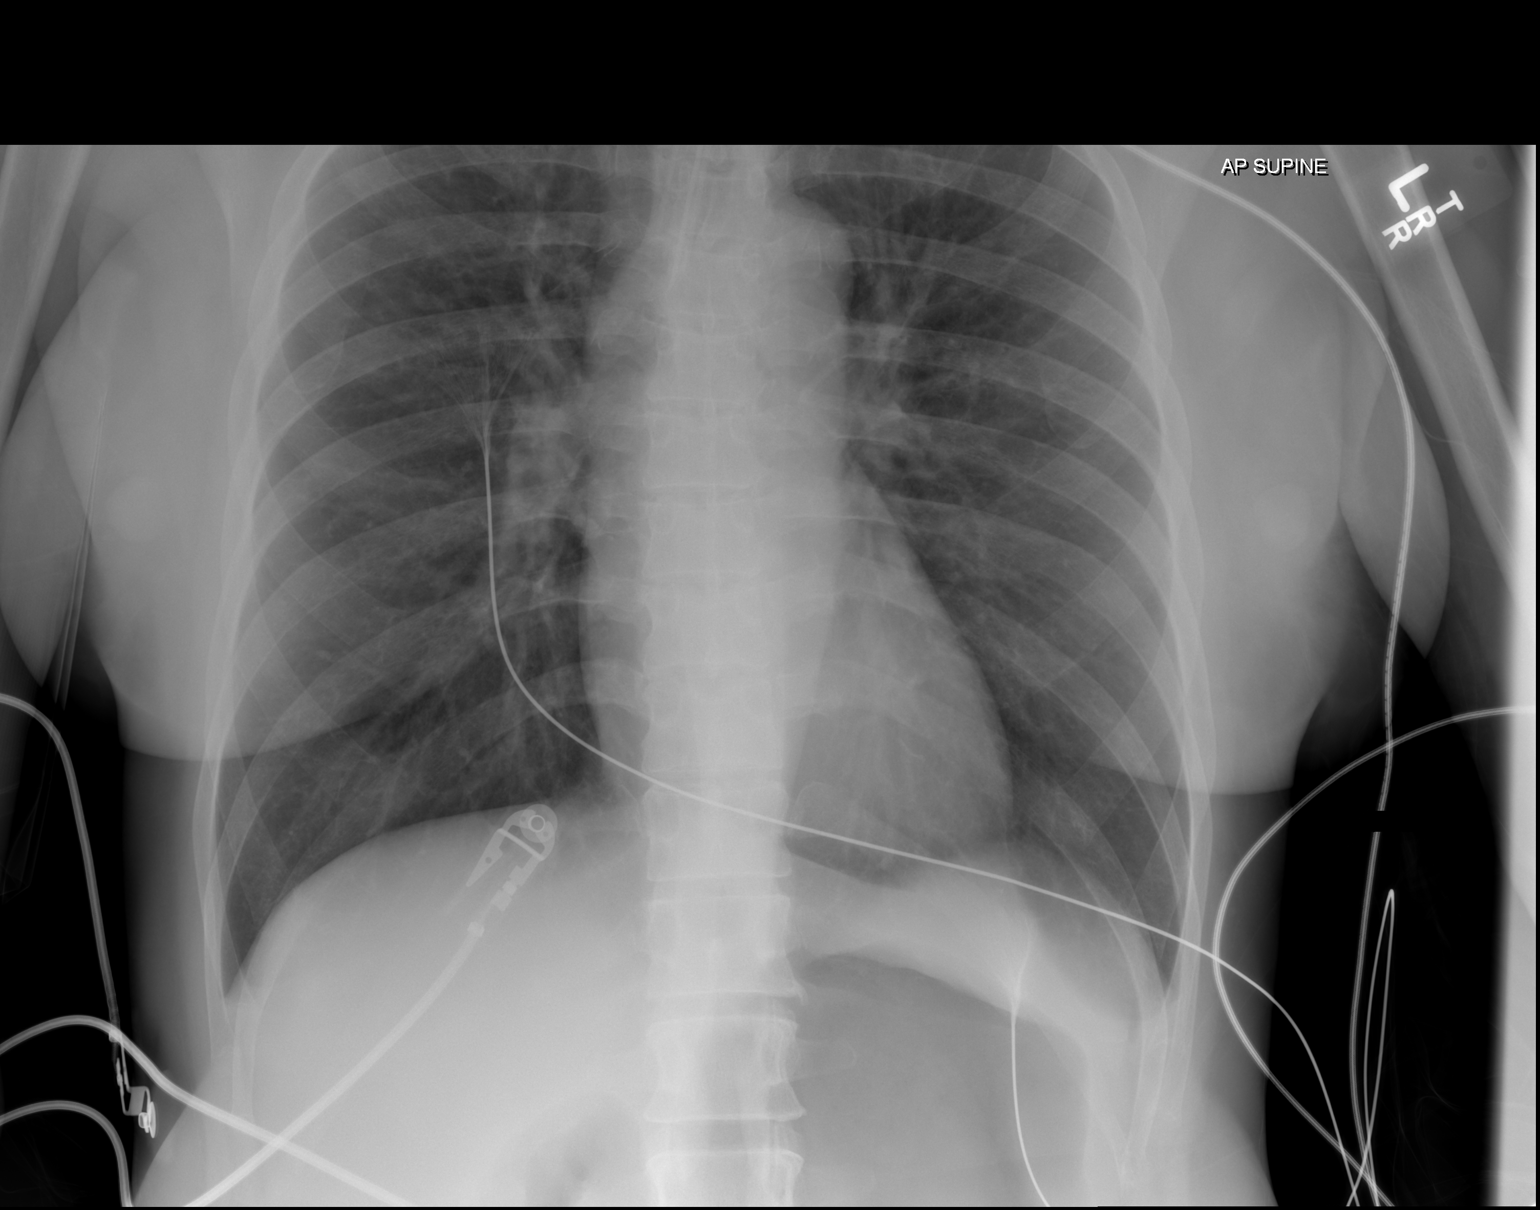

[2 of 2 positions shown; findings below may reference images not displayed]

FINDINGS: Patient has been intubated, with the tip of the endotracheal tube in
and low position only 7 mm above the carina, directed toward the
right mainstem bronchus. Lung volumes are normal. No consolidative
airspace disease. No pleural effusions. No pneumothorax. No
pulmonary nodule or mass noted. Pulmonary vasculature and the
cardiomediastinal silhouette are within normal limits. Orthopedic
fixation hardware through the cervical spine.
IMPRESSION: 1. Low position of endotracheal tube which lies only 7 mm above the
carina, directed toward the right mainstem bronchus. This could be
withdrawn 2-3 for more optimal placement.
2. No radiographic evidence of acute cardiopulmonary disease.
Critical Value/emergent results were called by telephone at the time
of interpretation on 03/03/2013 at [DATE] to Dr.BUTACAN ERMOGENOUS , who
verbally acknowledged these results.
